# Patient Record
Sex: Male | Born: 1945
Health system: Southern US, Community
[De-identification: ages and names within clinical notes are randomized; demographics above are authoritative.]

## PROBLEM LIST (undated history)

## (undated) DIAGNOSIS — H269 Unspecified cataract: Secondary | ICD-10-CM

## (undated) DIAGNOSIS — K219 Gastro-esophageal reflux disease without esophagitis: Secondary | ICD-10-CM

## (undated) DIAGNOSIS — H409 Unspecified glaucoma: Secondary | ICD-10-CM

## (undated) DIAGNOSIS — K269 Duodenal ulcer, unspecified as acute or chronic, without hemorrhage or perforation: Secondary | ICD-10-CM

## (undated) DIAGNOSIS — Z77098 Contact with and (suspected) exposure to other hazardous, chiefly nonmedicinal, chemicals: Secondary | ICD-10-CM

## (undated) DIAGNOSIS — T7840XA Allergy, unspecified, initial encounter: Secondary | ICD-10-CM

## (undated) DIAGNOSIS — Z7739 Contact with and (suspected) exposure to other war theater: Secondary | ICD-10-CM

## (undated) DIAGNOSIS — Z5189 Encounter for other specified aftercare: Secondary | ICD-10-CM

## (undated) DIAGNOSIS — E7800 Pure hypercholesterolemia, unspecified: Secondary | ICD-10-CM

## (undated) DIAGNOSIS — I10 Essential (primary) hypertension: Secondary | ICD-10-CM

## (undated) DIAGNOSIS — R011 Cardiac murmur, unspecified: Secondary | ICD-10-CM

## (undated) DIAGNOSIS — K449 Diaphragmatic hernia without obstruction or gangrene: Secondary | ICD-10-CM

## (undated) DIAGNOSIS — M1990 Unspecified osteoarthritis, unspecified site: Secondary | ICD-10-CM

## (undated) DIAGNOSIS — D649 Anemia, unspecified: Secondary | ICD-10-CM

## (undated) DIAGNOSIS — C801 Malignant (primary) neoplasm, unspecified: Secondary | ICD-10-CM

## (undated) HISTORY — DX: Malignant (primary) neoplasm, unspecified: C80.1

## (undated) HISTORY — DX: Unspecified osteoarthritis, unspecified site: M19.90

## (undated) HISTORY — DX: Duodenal ulcer, unspecified as acute or chronic, without hemorrhage or perforation: K26.9

## (undated) HISTORY — DX: Contact with and (suspected) exposure to other war theater: Z77.39

## (undated) HISTORY — PX: CAPSULOTOMY: SHX379

## (undated) HISTORY — PX: APPENDECTOMY: SHX54

## (undated) HISTORY — DX: Contact with and (suspected) exposure to other hazardous, chiefly nonmedicinal, chemicals: Z77.098

## (undated) HISTORY — PX: HAND SURGERY: SHX662

## (undated) HISTORY — DX: Unspecified cataract: H26.9

## (undated) HISTORY — DX: Unspecified glaucoma: H40.9

## (undated) HISTORY — DX: Anemia, unspecified: D64.9

## (undated) HISTORY — DX: Encounter for other specified aftercare: Z51.89

## (undated) HISTORY — DX: Cardiac murmur, unspecified: R01.1

## (undated) HISTORY — DX: Allergy, unspecified, initial encounter: T78.40XA

---

## 2001-02-09 ENCOUNTER — Encounter: Payer: Self-pay | Admitting: Orthopedic Surgery

## 2001-02-09 ENCOUNTER — Ambulatory Visit (HOSPITAL_COMMUNITY): Admission: RE | Admit: 2001-02-09 | Discharge: 2001-02-09 | Payer: Self-pay | Admitting: Orthopedic Surgery

## 2001-02-24 ENCOUNTER — Encounter: Payer: Self-pay | Admitting: Orthopedic Surgery

## 2001-03-03 ENCOUNTER — Observation Stay (HOSPITAL_COMMUNITY): Admission: RE | Admit: 2001-03-03 | Discharge: 2001-03-04 | Payer: Self-pay | Admitting: Orthopedic Surgery

## 2003-09-07 HISTORY — PX: ROTATOR CUFF REPAIR: SHX139

## 2011-09-07 HISTORY — PX: CATARACT EXTRACTION: SUR2

## 2013-09-06 HISTORY — PX: HAND SURGERY: SHX662

## 2014-11-04 DIAGNOSIS — Z961 Presence of intraocular lens: Secondary | ICD-10-CM | POA: Diagnosis not present

## 2014-12-24 DIAGNOSIS — E780 Pure hypercholesterolemia: Secondary | ICD-10-CM | POA: Diagnosis not present

## 2014-12-24 DIAGNOSIS — I10 Essential (primary) hypertension: Secondary | ICD-10-CM | POA: Diagnosis not present

## 2014-12-24 DIAGNOSIS — Z79899 Other long term (current) drug therapy: Secondary | ICD-10-CM | POA: Diagnosis not present

## 2014-12-24 DIAGNOSIS — K219 Gastro-esophageal reflux disease without esophagitis: Secondary | ICD-10-CM | POA: Diagnosis not present

## 2014-12-24 DIAGNOSIS — Z1389 Encounter for screening for other disorder: Secondary | ICD-10-CM | POA: Diagnosis not present

## 2014-12-24 DIAGNOSIS — Z6828 Body mass index (BMI) 28.0-28.9, adult: Secondary | ICD-10-CM | POA: Diagnosis not present

## 2015-01-02 DIAGNOSIS — L570 Actinic keratosis: Secondary | ICD-10-CM | POA: Diagnosis not present

## 2015-03-17 DIAGNOSIS — M7551 Bursitis of right shoulder: Secondary | ICD-10-CM | POA: Diagnosis not present

## 2015-06-26 DIAGNOSIS — Z23 Encounter for immunization: Secondary | ICD-10-CM | POA: Diagnosis not present

## 2015-06-26 DIAGNOSIS — Z125 Encounter for screening for malignant neoplasm of prostate: Secondary | ICD-10-CM | POA: Diagnosis not present

## 2015-06-26 DIAGNOSIS — I10 Essential (primary) hypertension: Secondary | ICD-10-CM | POA: Diagnosis not present

## 2015-06-26 DIAGNOSIS — K219 Gastro-esophageal reflux disease without esophagitis: Secondary | ICD-10-CM | POA: Diagnosis not present

## 2015-06-26 DIAGNOSIS — Z9181 History of falling: Secondary | ICD-10-CM | POA: Diagnosis not present

## 2015-06-26 DIAGNOSIS — Z139 Encounter for screening, unspecified: Secondary | ICD-10-CM | POA: Diagnosis not present

## 2015-06-26 DIAGNOSIS — Z1389 Encounter for screening for other disorder: Secondary | ICD-10-CM | POA: Diagnosis not present

## 2015-06-26 DIAGNOSIS — Z0000 Encounter for general adult medical examination without abnormal findings: Secondary | ICD-10-CM | POA: Diagnosis not present

## 2015-06-26 DIAGNOSIS — E7800 Pure hypercholesterolemia, unspecified: Secondary | ICD-10-CM | POA: Diagnosis not present

## 2015-08-18 DIAGNOSIS — I493 Ventricular premature depolarization: Secondary | ICD-10-CM | POA: Diagnosis not present

## 2015-08-18 DIAGNOSIS — Z6829 Body mass index (BMI) 29.0-29.9, adult: Secondary | ICD-10-CM | POA: Diagnosis not present

## 2015-08-18 DIAGNOSIS — I10 Essential (primary) hypertension: Secondary | ICD-10-CM | POA: Diagnosis not present

## 2015-09-18 DIAGNOSIS — E7800 Pure hypercholesterolemia, unspecified: Secondary | ICD-10-CM | POA: Diagnosis not present

## 2015-09-18 DIAGNOSIS — I10 Essential (primary) hypertension: Secondary | ICD-10-CM | POA: Diagnosis not present

## 2015-12-15 DIAGNOSIS — Z961 Presence of intraocular lens: Secondary | ICD-10-CM | POA: Diagnosis not present

## 2015-12-15 DIAGNOSIS — H524 Presbyopia: Secondary | ICD-10-CM | POA: Diagnosis not present

## 2015-12-25 DIAGNOSIS — E663 Overweight: Secondary | ICD-10-CM | POA: Diagnosis not present

## 2015-12-25 DIAGNOSIS — J309 Allergic rhinitis, unspecified: Secondary | ICD-10-CM | POA: Diagnosis not present

## 2015-12-25 DIAGNOSIS — E7800 Pure hypercholesterolemia, unspecified: Secondary | ICD-10-CM | POA: Diagnosis not present

## 2015-12-25 DIAGNOSIS — I10 Essential (primary) hypertension: Secondary | ICD-10-CM | POA: Diagnosis not present

## 2015-12-25 DIAGNOSIS — K219 Gastro-esophageal reflux disease without esophagitis: Secondary | ICD-10-CM | POA: Diagnosis not present

## 2016-01-06 DIAGNOSIS — L570 Actinic keratosis: Secondary | ICD-10-CM | POA: Diagnosis not present

## 2016-01-06 DIAGNOSIS — L821 Other seborrheic keratosis: Secondary | ICD-10-CM | POA: Diagnosis not present

## 2016-07-01 DIAGNOSIS — Z9181 History of falling: Secondary | ICD-10-CM | POA: Diagnosis not present

## 2016-07-01 DIAGNOSIS — Z0000 Encounter for general adult medical examination without abnormal findings: Secondary | ICD-10-CM | POA: Diagnosis not present

## 2016-07-01 DIAGNOSIS — Z79899 Other long term (current) drug therapy: Secondary | ICD-10-CM | POA: Diagnosis not present

## 2016-07-01 DIAGNOSIS — R251 Tremor, unspecified: Secondary | ICD-10-CM | POA: Diagnosis not present

## 2016-07-01 DIAGNOSIS — Z1389 Encounter for screening for other disorder: Secondary | ICD-10-CM | POA: Diagnosis not present

## 2016-07-01 DIAGNOSIS — E7800 Pure hypercholesterolemia, unspecified: Secondary | ICD-10-CM | POA: Diagnosis not present

## 2016-07-01 DIAGNOSIS — Z23 Encounter for immunization: Secondary | ICD-10-CM | POA: Diagnosis not present

## 2016-07-01 DIAGNOSIS — Z125 Encounter for screening for malignant neoplasm of prostate: Secondary | ICD-10-CM | POA: Diagnosis not present

## 2016-07-01 DIAGNOSIS — I10 Essential (primary) hypertension: Secondary | ICD-10-CM | POA: Diagnosis not present

## 2016-11-30 DIAGNOSIS — C44619 Basal cell carcinoma of skin of left upper limb, including shoulder: Secondary | ICD-10-CM | POA: Diagnosis not present

## 2016-11-30 DIAGNOSIS — L570 Actinic keratosis: Secondary | ICD-10-CM | POA: Diagnosis not present

## 2016-12-16 DIAGNOSIS — H524 Presbyopia: Secondary | ICD-10-CM | POA: Diagnosis not present

## 2016-12-23 DIAGNOSIS — C44619 Basal cell carcinoma of skin of left upper limb, including shoulder: Secondary | ICD-10-CM | POA: Diagnosis not present

## 2016-12-31 DIAGNOSIS — I10 Essential (primary) hypertension: Secondary | ICD-10-CM | POA: Diagnosis not present

## 2016-12-31 DIAGNOSIS — E7800 Pure hypercholesterolemia, unspecified: Secondary | ICD-10-CM | POA: Diagnosis not present

## 2016-12-31 DIAGNOSIS — K219 Gastro-esophageal reflux disease without esophagitis: Secondary | ICD-10-CM | POA: Diagnosis not present

## 2017-03-28 DIAGNOSIS — H26491 Other secondary cataract, right eye: Secondary | ICD-10-CM | POA: Diagnosis not present

## 2017-04-13 DIAGNOSIS — H26491 Other secondary cataract, right eye: Secondary | ICD-10-CM | POA: Diagnosis not present

## 2017-05-17 DIAGNOSIS — L570 Actinic keratosis: Secondary | ICD-10-CM | POA: Diagnosis not present

## 2017-06-27 ENCOUNTER — Inpatient Hospital Stay (HOSPITAL_COMMUNITY)
Admission: EM | Admit: 2017-06-27 | Discharge: 2017-06-29 | DRG: 378 | Disposition: A | Discharge status: Home / Self Care | Payer: Medicare Other | Attending: Internal Medicine | Admitting: Internal Medicine

## 2017-06-27 ENCOUNTER — Encounter (HOSPITAL_COMMUNITY): Payer: Self-pay | Admitting: *Deleted

## 2017-06-27 DIAGNOSIS — D62 Acute posthemorrhagic anemia: Secondary | ICD-10-CM | POA: Diagnosis not present

## 2017-06-27 DIAGNOSIS — K219 Gastro-esophageal reflux disease without esophagitis: Secondary | ICD-10-CM | POA: Diagnosis not present

## 2017-06-27 DIAGNOSIS — D649 Anemia, unspecified: Secondary | ICD-10-CM | POA: Diagnosis present

## 2017-06-27 DIAGNOSIS — I10 Essential (primary) hypertension: Secondary | ICD-10-CM | POA: Diagnosis not present

## 2017-06-27 DIAGNOSIS — E785 Hyperlipidemia, unspecified: Secondary | ICD-10-CM | POA: Diagnosis present

## 2017-06-27 DIAGNOSIS — K319 Disease of stomach and duodenum, unspecified: Secondary | ICD-10-CM | POA: Diagnosis not present

## 2017-06-27 DIAGNOSIS — K254 Chronic or unspecified gastric ulcer with hemorrhage: Principal | ICD-10-CM | POA: Diagnosis present

## 2017-06-27 DIAGNOSIS — Z79899 Other long term (current) drug therapy: Secondary | ICD-10-CM

## 2017-06-27 DIAGNOSIS — R0602 Shortness of breath: Secondary | ICD-10-CM | POA: Diagnosis not present

## 2017-06-27 DIAGNOSIS — K922 Gastrointestinal hemorrhage, unspecified: Secondary | ICD-10-CM | POA: Diagnosis present

## 2017-06-27 DIAGNOSIS — Z7982 Long term (current) use of aspirin: Secondary | ICD-10-CM

## 2017-06-27 DIAGNOSIS — R002 Palpitations: Secondary | ICD-10-CM | POA: Diagnosis not present

## 2017-06-27 DIAGNOSIS — R195 Other fecal abnormalities: Secondary | ICD-10-CM

## 2017-06-27 DIAGNOSIS — K449 Diaphragmatic hernia without obstruction or gangrene: Secondary | ICD-10-CM | POA: Diagnosis not present

## 2017-06-27 DIAGNOSIS — K2981 Duodenitis with bleeding: Secondary | ICD-10-CM | POA: Diagnosis not present

## 2017-06-27 DIAGNOSIS — K269 Duodenal ulcer, unspecified as acute or chronic, without hemorrhage or perforation: Secondary | ICD-10-CM | POA: Diagnosis not present

## 2017-06-27 DIAGNOSIS — K257 Chronic gastric ulcer without hemorrhage or perforation: Secondary | ICD-10-CM | POA: Diagnosis present

## 2017-06-27 DIAGNOSIS — Z23 Encounter for immunization: Secondary | ICD-10-CM | POA: Diagnosis not present

## 2017-06-27 DIAGNOSIS — Z882 Allergy status to sulfonamides status: Secondary | ICD-10-CM

## 2017-06-27 DIAGNOSIS — Z862 Personal history of diseases of the blood and blood-forming organs and certain disorders involving the immune mechanism: Secondary | ICD-10-CM | POA: Diagnosis present

## 2017-06-27 HISTORY — DX: Gastro-esophageal reflux disease without esophagitis: K21.9

## 2017-06-27 HISTORY — DX: Pure hypercholesterolemia, unspecified: E78.00

## 2017-06-27 HISTORY — DX: Essential (primary) hypertension: I10

## 2017-06-27 HISTORY — DX: Diaphragmatic hernia without obstruction or gangrene: K44.9

## 2017-06-27 LAB — COMPREHENSIVE METABOLIC PANEL
ALT: 30 U/L (ref 17–63)
ANION GAP: 7 (ref 5–15)
AST: 32 U/L (ref 15–41)
Albumin: 4.1 g/dL (ref 3.5–5.0)
Alkaline Phosphatase: 40 U/L (ref 38–126)
BILIRUBIN TOTAL: 0.3 mg/dL (ref 0.3–1.2)
BUN: 11 mg/dL (ref 6–20)
CALCIUM: 9 mg/dL (ref 8.9–10.3)
CO2: 23 mmol/L (ref 22–32)
CREATININE: 0.83 mg/dL (ref 0.61–1.24)
Chloride: 107 mmol/L (ref 101–111)
GLUCOSE: 126 mg/dL — AB (ref 65–99)
POTASSIUM: 3.5 mmol/L (ref 3.5–5.1)
Sodium: 137 mmol/L (ref 135–145)
Total Protein: 6.5 g/dL (ref 6.5–8.1)

## 2017-06-27 LAB — CBC
HCT: 22.5 % — ABNORMAL LOW (ref 39.0–52.0)
HEMOGLOBIN: 6.7 g/dL — AB (ref 13.0–17.0)
MCH: 24.5 pg — AB (ref 26.0–34.0)
MCHC: 29.8 g/dL — AB (ref 30.0–36.0)
MCV: 82.4 fL (ref 78.0–100.0)
PLATELETS: 341 10*3/uL (ref 150–400)
RBC: 2.73 MIL/uL — ABNORMAL LOW (ref 4.22–5.81)
RDW: 16.3 % — ABNORMAL HIGH (ref 11.5–15.5)
WBC: 9 10*3/uL (ref 4.0–10.5)

## 2017-06-27 LAB — PREPARE RBC (CROSSMATCH)

## 2017-06-27 LAB — POC OCCULT BLOOD, ED: Fecal Occult Bld: POSITIVE — AB

## 2017-06-27 LAB — ABO/RH: ABO/RH(D): O NEG

## 2017-06-27 MED ORDER — PANTOPRAZOLE SODIUM 40 MG IV SOLR: 40.0000 mg | Freq: Two times a day (BID) | INTRAVENOUS | Status: DC

## 2017-06-27 MED ORDER — ACETAMINOPHEN 325 MG PO TABS: 650.0000 mg | ORAL_TABLET | Freq: Four times a day (QID) | ORAL | Status: DC | PRN

## 2017-06-27 MED ORDER — SODIUM CHLORIDE 0.9 % IV SOLN
Freq: Once | INTRAVENOUS | Status: AC
  Administered 2017-06-27: 22:00:00 via INTRAVENOUS

## 2017-06-27 MED ORDER — ONDANSETRON HCL 4 MG/2ML IJ SOLN: 4.0000 mg | Freq: Four times a day (QID) | INTRAMUSCULAR | Status: DC | PRN

## 2017-06-27 MED ORDER — PANTOPRAZOLE SODIUM 40 MG IV SOLR
40.0000 mg | Freq: Once | INTRAVENOUS | Status: AC
  Administered 2017-06-28: 40 mg via INTRAVENOUS
  Filled 2017-06-27: qty 40

## 2017-06-27 MED ORDER — SODIUM CHLORIDE 0.9 % IV SOLN
INTRAVENOUS | Status: AC
  Administered 2017-06-28 (×2): via INTRAVENOUS

## 2017-06-27 MED ORDER — SODIUM CHLORIDE 0.9 % IV SOLN
8.0000 mg/h | INTRAVENOUS | Status: DC
  Administered 2017-06-28 (×2): 8 mg/h via INTRAVENOUS
  Filled 2017-06-27 (×3): qty 80

## 2017-06-27 MED ORDER — HYDRALAZINE HCL 20 MG/ML IJ SOLN: 10.0000 mg | INTRAMUSCULAR | Status: DC | PRN

## 2017-06-27 MED ORDER — ACETAMINOPHEN 650 MG RE SUPP: 650.0000 mg | Freq: Four times a day (QID) | RECTAL | Status: DC | PRN

## 2017-06-27 MED ORDER — ONDANSETRON HCL 4 MG PO TABS: 4.0000 mg | ORAL_TABLET | Freq: Four times a day (QID) | ORAL | Status: DC | PRN

## 2017-06-27 NOTE — H&P (Signed)
History and Physical    Leroy Horton HBZ:[id number] DOB: 08-07-1946 DOA: 06/27/2017  PCP: No primary care provider on file.  Patient coming from: Home.  Chief Complaint: Weakness.  HPI: Leroy Horton is a 71 y.o. male with history of hypertension and hyperlipidemia and GERD has been experiencing weakness and fatigue over the last 4-5 days.  Patient had followed up with his primary care physician today and was found to have a hemoglobin around 6.  Patient also has been noticing some black stools.  Patient takes ibuprofen off and on for pain.  Patient was referred to the ER for symptomatic anemia likely from GI bleed.  ED Course: In the ER patient was stool for occult blood positive.  Hemoglobin was around 6 and 1 unit of PRBC was ordered.  Patient was started on Protonix.  Admitted for GI bleeding.  Patient states he has never had any EGD or colonoscopy previously.  Review of Systems: As per HPI, rest all negative.   Past Medical History:  Diagnosis Date  . Acid reflux   . High cholesterol   . Hypertension     Past Surgical History:  Procedure Laterality Date  . APPENDECTOMY    . HAND SURGERY       reports that he has never smoked. He has never used smokeless tobacco. He reports that he drinks alcohol. He reports that he does not use drugs.  Allergies  Allergen Reactions  . Sulfa Antibiotics   . Sulfamethoxazole Rash    Family History  Problem Relation Age of Onset  . Cancer Neg Hx     Prior to Admission medications   Medication Sig Start Date End Date Taking? Authorizing Provider  amLODipine (NORVASC) 10 MG tablet Take 10 mg by mouth daily. 05/20/17  Yes [provider]  fluticasone (FLONASE) 50 MCG/ACT nasal spray Place 2 sprays into both nostrils daily as needed for allergies.  06/16/17  Yes [provider]  losartan (COZAAR) 100 MG tablet Take 100 mg by mouth daily. 05/20/17  Yes [provider]  pravastatin (PRAVACHOL) 40 MG tablet  Take 20 mg by mouth at bedtime.  03/22/17  Yes [provider]  ranitidine (ZANTAC) 300 MG tablet Take 300 mg by mouth at bedtime.  06/14/17  Yes [provider]    Physical Exam: Vitals:   06/27/17 2215 06/27/17 2230 06/27/17 2245 06/27/17 2300  BP: 126/70 136/73 124/74 117/81  Pulse: 89 85 82 82  Resp: 12 16 20 15   Temp:      TempSrc:      SpO2: 98% 97% 98% 98%      Constitutional: Moderately built and nourished. Vitals:   06/27/17 2215 06/27/17 2230 06/27/17 2245 06/27/17 2300  BP: 126/70 136/73 124/74 117/81  Pulse: 89 85 82 82  Resp: 12 16 20 15   Temp:      TempSrc:      SpO2: 98% 97% 98% 98%   Eyes: Anicteric mild pallor. ENMT: No discharge from the ears eyes nose or mouth. Neck: No mass felt.  No JVD appreciated. Respiratory: No rhonchi or crepitations. Cardiovascular: S1-S2 heard no murmurs appreciated. Abdomen: Soft nontender bowel sounds present. Musculoskeletal: No edema.  No joint effusion. Skin: No rash.  The skin appears warm. Neurologic: Alert awake oriented to time place and person.  Moves all extremities. Psychiatric: Appears normal.  Normal affect.   Labs on Admission: I have personally reviewed following labs and imaging studies  CBC:  Recent Labs Lab 06/27/17  1731  WBC 9.0  HGB 6.7*  HCT 22.5*  MCV 82.4  PLT 287   Basic Metabolic Panel:  Recent Labs Lab 06/27/17 1731  NA 137  K 3.5  CL 107  CO2 23  GLUCOSE 126*  BUN 11  CREATININE 0.83  CALCIUM 9.0   GFR: CrCl cannot be calculated (Unknown ideal weight.). Liver Function Tests:  Recent Labs Lab 06/27/17 1731  AST 32  ALT 30  ALKPHOS 40  BILITOT 0.3  PROT 6.5  ALBUMIN 4.1   No results for input(s): LIPASE, AMYLASE in the last 168 hours. No results for input(s): AMMONIA in the last 168 hours. Coagulation Profile: No results for input(s): INR, PROTIME in the last 168 hours. Cardiac Enzymes: No results for input(s): CKTOTAL, CKMB, CKMBINDEX, TROPONINI  in the last 168 hours. BNP (last 3 results) No results for input(s): PROBNP in the last 8760 hours. HbA1C: No results for input(s): HGBA1C in the last 72 hours. CBG: No results for input(s): GLUCAP in the last 168 hours. Lipid Profile: No results for input(s): CHOL, HDL, LDLCALC, TRIG, CHOLHDL, LDLDIRECT in the last 72 hours. Thyroid Function Tests: No results for input(s): TSH, T4TOTAL, FREET4, T3FREE, THYROIDAB in the last 72 hours. Anemia Panel: No results for input(s): VITAMINB12, FOLATE, FERRITIN, TIBC, IRON, RETICCTPCT in the last 72 hours. Urine analysis: No results found for: COLORURINE, APPEARANCEUR, LABSPEC, PHURINE, GLUCOSEU, HGBUR, BILIRUBINUR, KETONESUR, PROTEINUR, UROBILINOGEN, NITRITE, LEUKOCYTESUR Sepsis Labs: @LABRCNTIP (procalcitonin:4,lacticidven:4) )No results found for this or any previous visit (from the past 240 hour(s)).   Radiological Exams on Admission: No results found.   Assessment/Plan Principal Problem:   Acute GI bleeding Active Problems:   Acute blood loss anemia   GERD (gastroesophageal reflux disease)   Essential hypertension    1. Acute GI bleeding -likely upper GI bleed.  Patient does take NSAIDs.  Patient also had melanotic stools.  Patient has been placed on Protonix infusion.  1 unit of PRBC transfusion has been ordered.  Recheck CBC.  Consult gastroenterologist in a.m.  Patient has never had any EGD or colonoscopy previously. 2. Acute blood loss anemia -follow CBC after transfusion. 3. Hypertension -we will keep patient on as needed IV hydralazine for now since patient is n.p.o. 4. Hyperlipidemia on statins. 5. History of GERD.   DVT prophylaxis: SCDs. Code Status: Full code. Family Communication: Discussed with patient's wife. Disposition Plan: Home. Consults called: None. Admission status: Observation.   Rise Patience MD [facility] Pager [phone].  If 7PM-7AM, please contact  night-coverage [email or web address] Password [email or web address]  06/27/2017, 11:26 PM

## 2017-06-27 NOTE — ED Provider Notes (Signed)
[facility] EMERGENCY DEPARTMENT Provider Note   CSN: [id number] Arrival date & time: 06/27/17  1706     History   Chief Complaint Chief Complaint  Patient presents with  . Weakness  . Abnormal Lab    HPI Leroy Horton is a 71 y.o. male with a history of hypertension, GERD, hypercholesteremia, and chronic low back pain who presents to the emergency department with a chief complaint of weakness and lightheadedness that began 3 days ago. No h/o of similar. No recent trauma or injuries. He also complains of intermittent dyspnea with exertion and palpitations that began at the same time. He denies chest pain or cough. He states that he also noticed that his stools seemed "darker brown" than usual, but denies BRBPR. No hematemesis, syncope, or N/V/D. he reports that his symptoms worsen 2 days ago, and he was concerned that his symptoms were a side effect of his blood pressure medication so his cut his amlodipine in half. Today, he reports he halved his amlodipine and losartan without improvement so he scheduled an appointment with his PCP for evaluation.  Patient reports that he was established with GI in 1990's when he was having some difficulty swallowing. He reports he has an EGD performed and his esophagus was stretched. He has never had a colonoscopy. He is currently taking Zantac prescribed by his PCP, but was previously taking Prilosec for years. He reports occasional alcohol use.   PCP is Dr. Cyndi Bender at [facility].  The history is provided by the patient. No language interpreter was used.    Past Medical History:  Diagnosis Date  . Acid reflux   . High cholesterol   . Hypertension     Patient Active Problem List   Diagnosis Date Noted  . Acute GI bleeding 06/27/2017    Past Surgical History:  Procedure Laterality Date  . APPENDECTOMY    . HAND SURGERY       Home Medications    Prior to Admission medications   Medication Sig  Start Date End Date Taking? Authorizing Provider  amLODipine (NORVASC) 10 MG tablet Take 10 mg by mouth daily. 05/20/17  Yes [provider]  fluticasone (FLONASE) 50 MCG/ACT nasal spray Place 2 sprays into both nostrils daily as needed for allergies.  06/16/17  Yes [provider]  losartan (COZAAR) 100 MG tablet Take 100 mg by mouth daily. 05/20/17  Yes [provider]  pravastatin (PRAVACHOL) 40 MG tablet Take 20 mg by mouth at bedtime.  03/22/17  Yes [provider]  ranitidine (ZANTAC) 300 MG tablet Take 300 mg by mouth at bedtime.  06/14/17  Yes [provider]    Family History Family History  Problem Relation Age of Onset  . Cancer Neg Hx     Social History Social History  Substance Use Topics  . Smoking status: Never Smoker  . Smokeless tobacco: Never Used  . Alcohol use Yes     Comment: occ     Allergies   Sulfa antibiotics and Sulfamethoxazole   Review of Systems Review of Systems  Constitutional: Negative for activity change, chills and fever.  Respiratory: Negative for shortness of breath.   Cardiovascular: Negative for chest pain.  Gastrointestinal: Positive for blood in stool. Negative for abdominal pain, constipation, diarrhea, nausea and vomiting.  Genitourinary: Negative for hematuria.  Musculoskeletal: Negative for back pain.  Skin: Negative for rash and wound.  Allergic/Immunologic: Negative for immunocompromised state.  Neurological: Positive for dizziness, weakness  and light-headedness. Negative for headaches.     Physical Exam Updated Vital Signs BP 140/73   Pulse 87   Temp 98.2 F (36.8 C) (Oral)   Resp 12   SpO2 98%   Physical Exam  Constitutional: He appears well-developed.  HENT:  Head: Normocephalic.  Eyes: Conjunctivae are normal.  Neck: Neck supple.  Cardiovascular: Normal rate, regular rhythm and normal heart sounds.  Exam reveals no gallop and no friction rub.   No murmur  heard. Pulmonary/Chest: Effort normal and breath sounds normal. No respiratory distress. He has no wheezes. He has no rales.  Abdominal: Soft. Bowel sounds are normal. He exhibits no distension and no mass. There is no tenderness. There is no rebound and no guarding.  No CVA tenderness bilaterally. No pulsating masses. Negative Murphy sign. No tenderness over McBurney's point. No peritoneal signs.  Genitourinary:  Genitourinary Comments: Chaperoned exam. Soft brown stool noted. No gross blood. The prostate is non-tender. No fissures or tears noted near the rectum.   Musculoskeletal: He exhibits no tenderness.  Neurological: He is alert.  Skin: Skin is warm and dry. Capillary refill takes less than 2 seconds. There is pallor.  Psychiatric: His behavior is normal.  Nursing note and vitals reviewed.    ED Treatments / Results  Labs (all labs ordered are listed, but only abnormal results are displayed) Labs Reviewed  COMPREHENSIVE METABOLIC PANEL - Abnormal; Notable for the following:       Result Value   Glucose, Bld 126 (*)    All other components within normal limits  CBC - Abnormal; Notable for the following:    RBC 2.73 (*)    Hemoglobin 6.7 (*)    HCT 22.5 (*)    MCH 24.5 (*)    MCHC 29.8 (*)    RDW 16.3 (*)    All other components within normal limits  POC OCCULT BLOOD, ED - Abnormal; Notable for the following:    Fecal Occult Bld POSITIVE (*)    All other components within normal limits  TYPE AND SCREEN  ABO/RH  PREPARE RBC (CROSSMATCH)    EKG  EKG Interpretation None       Radiology No results found.  Procedures Procedures (including critical care time) CRITICAL CARE Performed by: Joanne Gavel Total critical care time: 30 minutes Critical care time was exclusive of separately billable procedures and treating other patients. Critical care was necessary to treat or prevent imminent or life-threatening deterioration. Critical care was time spent personally  by me on the following activities: development of treatment plan with patient and/or surrogate as well as nursing, discussions with consultants, evaluation of patient's response to treatment, examination of patient, obtaining history from patient or surrogate, ordering and performing treatments and interventions, ordering and review of laboratory studies, ordering and review of radiographic studies, pulse oximetry and re-evaluation of patient's condition.  Medications Ordered in ED Medications  pantoprazole (PROTONIX) injection 40 mg (not administered)  0.9 %  sodium chloride infusion ( Intravenous New Bag/Given 06/27/17 2144)     Initial Impression / Assessment and Plan / ED Course  I have reviewed the triage vital signs and the nursing notes.  Pertinent labs & imaging results that were available during my care of the patient were reviewed by me and considered in my medical decision making (see chart for details).     71 year old male with a history of GERD, hypercholesteremia, and hypertension presenting with 3 days of weakness, lightheadedness, and dark stools. Hgb 6.7. The patient  was typed and screened and agreeable to blood transfusion. Hemoccult positive, likely upper GI bleed given his history of GERD. IV Protonix and fluid bolus given in the ED. EGD performed >20 years ago. No h/o of colonoscopy. Consulted the hospitalist, Dr. Hal Hope will admit the patient for symptomatic anemia and positive hemoccult result. The patient appears reasonably stabilized for admission considering the current resources, flow, and capabilities available in the ED at this time, and I doubt any other [facility] requiring further screening and/or treatment in the ED prior to admission.  Final Clinical Impressions(s) / ED Diagnoses   Final diagnoses:  Symptomatic anemia  Heme positive stool    New Prescriptions New Prescriptions   No medications on file     Perrin Smack 06/27/17 2304    Tanna Furry, MD 07/15/17 2307

## 2017-06-27 NOTE — ED Notes (Signed)
Patient is stable and ready to be transport to the floor at this time.  Report was called to 5W RN.  Belongings taken with the patient to the floor.   

## 2017-06-27 NOTE — ED Notes (Signed)
Notified dr Billy Fischer of Hgb 6.7

## 2017-06-27 NOTE — ED Provider Notes (Signed)
Patient seen and evaluated. Discussed with Mia McDonald PA-C. Progressive dyspnea and weakness. Found to have hemoglobin of 6 today. Guaiac positive. Recent dark stools. History of GERD. Take Zantac but not daily. No bright red blood paretic in. Has not had screening colonoscopy in his life. Agree with transfusion, Protonix bolus and infusion. GI consultation and admission.   Tanna Furry, MD 06/27/17 2233

## 2017-06-27 NOTE — ED Triage Notes (Signed)
Pt reports going to pcp due to feeling weak, fatigued and lightheaded. Sent here due to hgb of 6.4. Pt appears very pale at triage. Denies being on blood thinners, reports recent dark stools noted.

## 2017-06-28 ENCOUNTER — Encounter (HOSPITAL_COMMUNITY): Payer: Self-pay | Admitting: *Deleted

## 2017-06-28 ENCOUNTER — Encounter (HOSPITAL_COMMUNITY)
Admission: EM | Disposition: A | Discharge status: Home / Self Care | Payer: Self-pay | Source: Home / Self Care | Attending: Internal Medicine

## 2017-06-28 DIAGNOSIS — K269 Duodenal ulcer, unspecified as acute or chronic, without hemorrhage or perforation: Secondary | ICD-10-CM

## 2017-06-28 DIAGNOSIS — K921 Melena: Secondary | ICD-10-CM | POA: Diagnosis not present

## 2017-06-28 DIAGNOSIS — K257 Chronic gastric ulcer without hemorrhage or perforation: Secondary | ICD-10-CM | POA: Diagnosis present

## 2017-06-28 DIAGNOSIS — I8500 Esophageal varices without bleeding: Secondary | ICD-10-CM | POA: Diagnosis not present

## 2017-06-28 DIAGNOSIS — K219 Gastro-esophageal reflux disease without esophagitis: Secondary | ICD-10-CM | POA: Diagnosis present

## 2017-06-28 DIAGNOSIS — Z79899 Other long term (current) drug therapy: Secondary | ICD-10-CM | POA: Diagnosis not present

## 2017-06-28 DIAGNOSIS — K2981 Duodenitis with bleeding: Secondary | ICD-10-CM | POA: Diagnosis not present

## 2017-06-28 DIAGNOSIS — E785 Hyperlipidemia, unspecified: Secondary | ICD-10-CM | POA: Diagnosis present

## 2017-06-28 DIAGNOSIS — D649 Anemia, unspecified: Secondary | ICD-10-CM | POA: Diagnosis not present

## 2017-06-28 DIAGNOSIS — K449 Diaphragmatic hernia without obstruction or gangrene: Secondary | ICD-10-CM | POA: Diagnosis present

## 2017-06-28 DIAGNOSIS — Z7982 Long term (current) use of aspirin: Secondary | ICD-10-CM | POA: Diagnosis not present

## 2017-06-28 DIAGNOSIS — K319 Disease of stomach and duodenum, unspecified: Secondary | ICD-10-CM | POA: Diagnosis present

## 2017-06-28 DIAGNOSIS — R195 Other fecal abnormalities: Secondary | ICD-10-CM | POA: Diagnosis present

## 2017-06-28 DIAGNOSIS — K254 Chronic or unspecified gastric ulcer with hemorrhage: Secondary | ICD-10-CM | POA: Diagnosis present

## 2017-06-28 DIAGNOSIS — K3189 Other diseases of stomach and duodenum: Secondary | ICD-10-CM | POA: Diagnosis not present

## 2017-06-28 DIAGNOSIS — Z882 Allergy status to sulfonamides status: Secondary | ICD-10-CM | POA: Diagnosis not present

## 2017-06-28 DIAGNOSIS — I10 Essential (primary) hypertension: Secondary | ICD-10-CM | POA: Diagnosis not present

## 2017-06-28 DIAGNOSIS — K922 Gastrointestinal hemorrhage, unspecified: Secondary | ICD-10-CM | POA: Diagnosis not present

## 2017-06-28 DIAGNOSIS — Z862 Personal history of diseases of the blood and blood-forming organs and certain disorders involving the immune mechanism: Secondary | ICD-10-CM | POA: Diagnosis present

## 2017-06-28 DIAGNOSIS — D62 Acute posthemorrhagic anemia: Secondary | ICD-10-CM

## 2017-06-28 HISTORY — PX: ESOPHAGOGASTRODUODENOSCOPY: SHX5428

## 2017-06-28 HISTORY — DX: Diaphragmatic hernia without obstruction or gangrene: K44.9

## 2017-06-28 LAB — GLUCOSE, CAPILLARY
GLUCOSE-CAPILLARY: 92 mg/dL (ref 65–99)
GLUCOSE-CAPILLARY: 96 mg/dL (ref 65–99)

## 2017-06-28 LAB — CBC
HCT: 21.8 % — ABNORMAL LOW (ref 39.0–52.0)
HCT: 22.9 % — ABNORMAL LOW (ref 39.0–52.0)
HEMATOCRIT: 26.1 % — AB (ref 39.0–52.0)
HEMOGLOBIN: 7.2 g/dL — AB (ref 13.0–17.0)
HEMOGLOBIN: 8.2 g/dL — AB (ref 13.0–17.0)
Hemoglobin: 6.8 g/dL — CL (ref 13.0–17.0)
MCH: 25.8 pg — ABNORMAL LOW (ref 26.0–34.0)
MCH: 25.9 pg — ABNORMAL LOW (ref 26.0–34.0)
MCH: 26.2 pg (ref 26.0–34.0)
MCHC: 31.2 g/dL (ref 30.0–36.0)
MCHC: 31.4 g/dL (ref 30.0–36.0)
MCHC: 31.4 g/dL (ref 30.0–36.0)
MCV: 82.6 fL (ref 78.0–100.0)
MCV: 82.6 fL (ref 78.0–100.0)
MCV: 83.3 fL (ref 78.0–100.0)
PLATELETS: 286 10*3/uL (ref 150–400)
PLATELETS: 295 10*3/uL (ref 150–400)
Platelets: 293 10*3/uL (ref 150–400)
RBC: 2.64 MIL/uL — AB (ref 4.22–5.81)
RBC: 2.75 MIL/uL — ABNORMAL LOW (ref 4.22–5.81)
RBC: 3.16 MIL/uL — AB (ref 4.22–5.81)
RDW: 15.7 % — ABNORMAL HIGH (ref 11.5–15.5)
RDW: 15.8 % — ABNORMAL HIGH (ref 11.5–15.5)
RDW: 16.1 % — ABNORMAL HIGH (ref 11.5–15.5)
WBC: 10 10*3/uL (ref 4.0–10.5)
WBC: 8.3 10*3/uL (ref 4.0–10.5)
WBC: 9.5 10*3/uL (ref 4.0–10.5)

## 2017-06-28 LAB — BASIC METABOLIC PANEL
ANION GAP: 6 (ref 5–15)
BUN: 11 mg/dL (ref 6–20)
CHLORIDE: 109 mmol/L (ref 101–111)
CO2: 23 mmol/L (ref 22–32)
Calcium: 8.1 mg/dL — ABNORMAL LOW (ref 8.9–10.3)
Creatinine, Ser: 0.72 mg/dL (ref 0.61–1.24)
GFR calc non Af Amer: >60 mL/min (ref >60)
Glucose, Bld: 104 mg/dL — ABNORMAL HIGH (ref 65–99)
POTASSIUM: 3.9 mmol/L (ref 3.5–5.1)
SODIUM: 138 mmol/L (ref 135–145)

## 2017-06-28 LAB — PREPARE RBC (CROSSMATCH)

## 2017-06-28 SURGERY — EGD (ESOPHAGOGASTRODUODENOSCOPY)
Anesthesia: Moderate Sedation

## 2017-06-28 MED ORDER — MIDAZOLAM HCL 10 MG/2ML IJ SOLN
INTRAMUSCULAR | Status: DC | PRN
  Administered 2017-06-28 (×3): 2 mg via INTRAVENOUS

## 2017-06-28 MED ORDER — SODIUM CHLORIDE 0.9 % IV SOLN
Freq: Once | INTRAVENOUS | Status: AC
  Administered 2017-06-28: 06:00:00 via INTRAVENOUS

## 2017-06-28 MED ORDER — PANTOPRAZOLE SODIUM 40 MG IV SOLR: 40.0000 mg | INTRAVENOUS | Status: DC

## 2017-06-28 MED ORDER — MIDAZOLAM HCL 5 MG/ML IJ SOLN
INTRAMUSCULAR | Status: AC
  Filled 2017-06-28: qty 2

## 2017-06-28 MED ORDER — BUTAMBEN-TETRACAINE-BENZOCAINE 2-2-14 % EX AERO
INHALATION_SPRAY | CUTANEOUS | Status: DC | PRN
  Administered 2017-06-28: 2 via TOPICAL

## 2017-06-28 MED ORDER — FENTANYL CITRATE (PF) 100 MCG/2ML IJ SOLN
INTRAMUSCULAR | Status: DC | PRN
  Administered 2017-06-28 (×3): 25 ug via INTRAVENOUS

## 2017-06-28 MED ORDER — PANTOPRAZOLE SODIUM 40 MG PO TBEC
40.0000 mg | DELAYED_RELEASE_TABLET | Freq: Every day | ORAL | Status: DC
Start: 2017-06-29 — End: 2017-06-29
  Administered 2017-06-29: 40 mg via ORAL
  Filled 2017-06-28: qty 1

## 2017-06-28 MED ORDER — SODIUM CHLORIDE 0.9 % IV SOLN
510.0000 mg | Freq: Once | INTRAVENOUS | Status: AC
  Administered 2017-06-28: 510 mg via INTRAVENOUS
  Filled 2017-06-28: qty 17

## 2017-06-28 MED ORDER — FAMOTIDINE 20 MG PO TABS
40.0000 mg | ORAL_TABLET | Freq: Every day | ORAL | Status: DC
  Administered 2017-06-28: 40 mg via ORAL
  Filled 2017-06-28: qty 2

## 2017-06-28 MED ORDER — FENTANYL CITRATE (PF) 100 MCG/2ML IJ SOLN
INTRAMUSCULAR | Status: AC
  Filled 2017-06-28: qty 2

## 2017-06-28 NOTE — Progress Notes (Signed)
Notified MD of HGB 6.8. Orders to transfuse 1 additional unit PRBCs.

## 2017-06-28 NOTE — Op Note (Addendum)
[facility] Patient Name: Leroy Horton Procedure Date : 06/28/2017 MRN: [id number] Attending MD: Gatha Mayer , MD Date of Birth: 10/05/1945 CSN: [id number] Age: 71 Admit Type: Inpatient Procedure:                Upper GI endoscopy Indications:              Acute post hemorrhagic anemia, Heme positive stool Providers:                Gatha Mayer, MD, Burtis Junes, RN, Elspeth Cho                            Tech., Technician Referring MD:              Medicines:                Midazolam 6 mg IV, Fentanyl 75 micrograms IV,                            Cetacaine spray Complications:            No immediate complications. Estimated Blood Loss:     Estimated blood loss was minimal. Procedure:                Pre-Anesthesia Assessment:                           - Prior to the procedure, a History and Physical                            was performed, and patient medications and                            allergies were reviewed. The patient's tolerance of                            previous anesthesia was also reviewed. The risks                            and benefits of the procedure and the sedation                            options and risks were discussed with the patient.                            All questions were answered, and informed consent                            was obtained. Prior Anticoagulants: The patient has                            taken no previous anticoagulant or antiplatelet                            agents. ASA Grade Assessment: II - A patient with  mild systemic disease. After reviewing the risks                            and benefits, the patient was deemed in                            satisfactory condition to undergo the procedure.                           After obtaining informed consent, the endoscope was                            passed under direct vision. Throughout the   procedure, the patient's blood pressure, pulse, and                            oxygen saturations were monitored continuously. The                            EG-[id number] ([id number]) scope was introduced through the                            mouth, and advanced to the second part of duodenum.                            The upper GI endoscopy was accomplished without                            difficulty. The patient tolerated the procedure                            well. Scope In: Scope Out: Findings:      Multiple dispersed, small non-bleeding erosions were found in the       gastric body. There were stigmata of recent bleeding.      A 10 cm hiatal hernia was present.      Many non-bleeding superficial duodenal ulcers were found in the first       portion of the duodenum and in the second portion of the duodenum.      The cardia and gastric fundus were normal on retroflexion.      The exam was otherwise without abnormality.      Biopsies were taken with a cold forceps in the gastric body and in the       gastric antrum for Helicobacter pylori testing using CLOtest.       Verification of patient identification for the specimen was done.       Estimated blood loss was minimal. Impression:               - Non-bleeding erosive gastropathy.                           - 10 cm hiatal hernia.                           - Multiple non-bleeding duodenal ulcers.                           -  The examination was otherwise normal.                           - Biopsies were taken with a cold forceps for                            Helicobacter pylori testing using CLOtest. Moderate Sedation:      Moderate (conscious) sedation was administered by the endoscopy nurse       and supervised by the endoscopist. The following parameters were       monitored: oxygen saturation, heart rate, blood pressure, respiratory       rate, EKG, adequacy of pulmonary ventilation, and response to care.       Total physician  intraservice time was 15 minutes. Recommendation:           - Return patient to hospital ward for ongoing care.                           - Resume regular diet.                           - Will await CLO test - could be false neg due to                            chronic H2 B and acute PPI                           Think he should consiuder an elective hiatal hernia                            repair - will discuss at somne point.                           I think we have explanations for bleeding and                            anemia though I am a bit puzzled by the superficial                            dudenal ulcers one possiblity is transient                            paraesopheagal hiatal hernia and torsion/volvulus                            several days ago.                           OK to go home tomorrow and I will arrange f/u w/ me                            - He has appt 10/29 w/ PCP already  Wants to go to beach later this week - ok by me                           Will do 1 dose feraheme here and send home on Fe                            SO4 325 mg bid w/ food                           Needs reflux precautions, elevate HOB at night                           Daily PPI in AM (pantoprazole 40 mg) and stay on                            300 mg ranitidine at bedtime                           - Continue present medications. Procedure Code(s):        --- Professional ---                           [id number], Esophagogastroduodenoscopy, flexible,                            transoral; with biopsy, single or multiple                           G0500, Moderate sedation services provided by the                            same physician or other qualified health care                            professional performing a gastrointestinal                            endoscopic service that sedation supports,                            requiring the presence of an  independent trained                            observer to assist in the monitoring of the                            patient's level of consciousness and physiological                            status; initial 15 minutes of intra-service time;                            patient age 50 years or older (additional time 73  be reported with [phone], as appropriate) Diagnosis Code(s):        --- Professional ---                           K31.89, Other diseases of stomach and duodenum                           K44.9, Diaphragmatic hernia without obstruction or                            gangrene                           K26.9, Duodenal ulcer, unspecified as acute or                            chronic, without hemorrhage or perforation                           D62, Acute posthemorrhagic anemia                           R19.5, Other fecal abnormalities CPT copyright 2016 [employer]. All rights reserved. The codes documented in this report are preliminary and upon coder review may  be revised to meet current compliance requirements. Gatha Mayer, MD 06/28/2017 3:15:27 PM This report has been signed electronically. Number of Addenda: 0

## 2017-06-28 NOTE — Progress Notes (Signed)
PROGRESS NOTE  Leroy Horton WUX:[id number] DOB: [birth year] DOA: 06/27/2017 PCP: Cyndi Bender, PA-C  HPI/Recap of past 24 hours: Leroy Horton is a 71 year old male with medical history significant for hypertension, GERD, hyperlipidemia, and chronic low back pain who presented to the ED on 06/27/2017 with complaints of weakness and lightheadedness for the past 3 days and was found to have symptomatic anemia with an admitting hemoglobin of 6.7 symptomatic anemia. Note patient reports remote history of EGD for esophageal dilation.  Patient has never had a colonoscopy  This morning patient states he feels much better after having recent blood transfusion.  Continues to deny any nausea, vomiting, abdominal pain, fevers or chills.  Assessment/Plan: Principal Problem:   Acute GI bleeding Active Problems:   Acute blood loss anemia   GERD (gastroesophageal reflux disease)   Essential hypertension   Symptomatic anemia  Symptomatic anemia Given age and no prior colonoscopy concern for colon cancer. Questionable melanotic stool per report but none on DRE. Current hemoglobin 8.2 after 1 unit transfusion -GI consulted, appreciate recs -IV Protonix twice daily -Transfusion for goal hemoglobin greater than 7  GERD Patient on Zantac as outpatient -Continue IV Protonix twice daily until GI evaluation  Hypertension Has had no episodes of hypotension during hospital stay -Holding home losartan and amlodipine to avoid any drops in blood pressure in setting of likely blood loss anemia  Code Status: Full code  Family Communication: No family at bedside, discussed plan with patient  Disposition Plan: Pending GI evaluation   Consultants:  Gastroenterology  Procedures:  None  Antimicrobials:  None  DVT prophylaxis: SCDs   Objective: Vitals:   06/27/17 2351 06/28/17 0524 06/28/17 0553 06/28/17 0810  BP:  127/68 (!) 123/58 133/67  Pulse:  75 74 74  Resp:  18 17   Temp:  98.6 F  (37 C) 98.4 F (36.9 C) 97.9 F (36.6 C)  TempSrc:  Oral Oral Oral  SpO2:  98% 100% 98%  Weight: 83.6 kg (184 lb 4.9 oz)     Height: 5\' 8"  (1.727 m)       Intake/Output Summary (Last 24 hours) at 06/28/17 1357 Last data filed at 06/28/17 0810  Gross per 24 hour  Intake          1138.33 ml  Output                0 ml  Net          1138.33 ml   Filed Weights   06/27/17 2351  Weight: 83.6 kg (184 lb 4.9 oz)    Exam:   General: Lying in bed comfortably, in no obvious distress  Cardiovascular: Regular rate and rhythm, no murmurs rubs or gallops, no peripheral edema  Respiratory: Auscultation bilaterally chest field on anterior chest field   Abdomen: on room air soft, nondistended, nontender, normoactive bowel sounds  Musculoskeletal: Normal range of motion  Psychiatry: Normal affect and mood   Data Reviewed: CBC:  Recent Labs Lab 06/27/17 1731 06/27/17 2353 06/28/17 0351 06/28/17 1112  WBC 9.0 10.0 8.3 9.5  HGB 6.7* 7.2* 6.8* 8.2*  HCT 22.5* 22.9* 21.8* 26.1*  MCV 82.4 83.3 82.6 82.6  PLT 341 295 286 253   Basic Metabolic Panel:  Recent Labs Lab 06/27/17 1731 06/28/17 0351  NA 137 138  K 3.5 3.9  CL 107 109  CO2 23 23  GLUCOSE 126* 104*  BUN 11 11  CREATININE 0.83 0.72  CALCIUM 9.0 8.1*   GFR: Estimated Creatinine  Clearance: 91.8 mL/min (by C-G formula based on SCr of 0.72 mg/dL). Liver Function Tests:  Recent Labs Lab 06/27/17 1731  AST 32  ALT 30  ALKPHOS 40  BILITOT 0.3  PROT 6.5  ALBUMIN 4.1   No results for input(s): LIPASE, AMYLASE in the last 168 hours. No results for input(s): AMMONIA in the last 168 hours. Coagulation Profile: No results for input(s): INR, PROTIME in the last 168 hours. Cardiac Enzymes: No results for input(s): CKTOTAL, CKMB, CKMBINDEX, TROPONINI in the last 168 hours. BNP (last 3 results) No results for input(s): PROBNP in the last 8760 hours. HbA1C: No results for input(s): HGBA1C in the last 72  hours. CBG:  Recent Labs Lab 06/28/17 0747  GLUCAP 92   Lipid Profile: No results for input(s): CHOL, HDL, LDLCALC, TRIG, CHOLHDL, LDLDIRECT in the last 72 hours. Thyroid Function Tests: No results for input(s): TSH, T4TOTAL, FREET4, T3FREE, THYROIDAB in the last 72 hours. Anemia Panel: No results for input(s): VITAMINB12, FOLATE, FERRITIN, TIBC, IRON, RETICCTPCT in the last 72 hours. Urine analysis: No results found for: COLORURINE, APPEARANCEUR, LABSPEC, PHURINE, GLUCOSEU, HGBUR, BILIRUBINUR, KETONESUR, PROTEINUR, UROBILINOGEN, NITRITE, LEUKOCYTESUR Sepsis Labs: @LABRCNTIP (procalcitonin:4,lacticidven:4)  )No results found for this or any previous visit (from the past 240 hour(s)).    Studies: No results found.  Scheduled Meds: . pantoprazole  40 mg Intravenous Q24H    Continuous Infusions: . sodium chloride 75 mL/hr at 06/28/17 1341     LOS: 0 days     Desiree Hane, MD [facility] Pager [phone]  If 7PM-7AM, please contact night-coverage [email or web address] Password [email or web address] 06/28/2017, 1:57 PM

## 2017-06-28 NOTE — Consult Note (Addendum)
[facility] Consult: 10:10 AM 06/28/2017  LOS: 0 days    Referring Provider: Dr Oretha Milch.  ([name]) Primary Care Physician:  Cyndi Bender, PA-C Primary Gastroenterologist:  unassigned     Reason for Consultation:  Anemia.  FOBT +.     HPI: Leroy Horton is a 71 y.o. male.  PMH of GERD.  Previous remote EGD with esophageal dilatation at [facility] but no Epic records of this found.   In 12/2016  PMD replaced Omeprozole with Ranitidine 300 mg at HS which pt says controls GER sxs better.   Patient has declined/refused referral for colonoscopy as recommended by PMD  On Friday 10/19 felt weaker, dizzy, DOE.  Sat, Sunday, Monday: dark, formed stools.  Anorexia started Saturday.  No N/V or GI upset.  Takes 81 ASA, no NSAIDs.  1/5th [place] lasts him 7 to 10 days.  No hx anemia.   Went to PMD, and Hgb 6.4, c/w 15 a year ago (per pt report).  Sent to ED.       Hgb 6.7 >> 7.2 >> 6.8.  MCV 82.  BUN, platelets normal.  No coags.    S/p PRBC x 2.  First late PM of 10/22, and at 530 this AM.     Past Medical History:  Diagnosis Date  . Acid reflux   . High cholesterol   . Hypertension     Past Surgical History:  Procedure Laterality Date  . APPENDECTOMY    . HAND SURGERY      Prior to Admission medications   Medication Sig Start Date End Date Taking? Authorizing Provider  amLODipine (NORVASC) 10 MG tablet Take 10 mg by mouth daily. 05/20/17  Yes [provider]  fluticasone (FLONASE) 50 MCG/ACT nasal spray Place 2 sprays into both nostrils daily as needed for allergies.  06/16/17  Yes [provider]  losartan (COZAAR) 100 MG tablet Take 100 mg by mouth daily. 05/20/17  Yes [provider]  pravastatin (PRAVACHOL) 40 MG tablet Take 20 mg by mouth at bedtime.  03/22/17  Yes  [provider]  ranitidine (ZANTAC) 300 MG tablet Take 300 mg by mouth at bedtime.  06/14/17  Yes [provider]    Scheduled Meds: . pantoprazole  40 mg Intravenous Q24H   Infusions: . sodium chloride 75 mL/hr at 06/28/17 0006   PRN Meds: acetaminophen **OR** acetaminophen, hydrALAZINE, ondansetron **OR** ondansetron (ZOFRAN) IV   Allergies as of 06/27/2017 - Review Complete 06/27/2017  Allergen Reaction Noted  . Sulfa antibiotics  06/27/2017  . Sulfamethoxazole Rash 12/15/2015    Family History  Problem Relation Age of Onset  . Cancer Neg Hx     Social History   Social History  . Marital status: Married   Social History Main Topics  . Smoking status: Never Smoker  . Smokeless tobacco: Never Used  . Alcohol use Yes     Comment: occ  . Drug use: No   REVIEW OF SYSTEMS: Constitutional:  Per HPI ENT:  No nose bleeds Pulm:  DOE is new.  Normally he walks 3 miles a day without issue and climb stairs without a problem. CV:  No chest pain. In recent days has noticed some mild tachycardia.  no LE edema.  GU:  No hematuria, no frequency GI:  Reflux symptoms are well controlled. Little to no dysphagia. See HPI.   Heme:  No excessive bleeding or bruising.   Transfusions:  Before last night had never received transfusion with blood products. Neuro:  No headaches, no peripheral tingling or numbness Derm:  No itching, no rash or sores.  Endocrine:  No sweats or chills.  No polyuria or dysuria Immunization: reviewed immunization records from his PMD he had tetanus in 2012, Pneumovax in 2016.  Travel:  None beyond local counties in last few months.    PHYSICAL EXAM: Vital signs in last 24 hours: Vitals:   06/28/17 0553 06/28/17 0810  BP: (!) 123/58 133/67  Pulse: 74 74  Resp: 17   Temp: 98.4 F (36.9 C) 97.9 F (36.6 C)  SpO2: 100% 98%   Wt Readings from Last 3 Encounters:  06/27/17 83.6 kg (184 lb 4.9 oz)    General: pleasant, appropriate pale  but well appearing WM.   Head:  Facial swelling or asymmetry. No signs of head trauma.  Eyes:  No scleral icterus. Conjunctiva moderately pale. EOMI. Ears:  Not hard of hearing  Nose:  No discharge or congestion Mouth:  Moist, pink, clear oral mucosa. Dentition in good repair. Tongue midline. Neck:  No JVD, no bruits, no masses, no thyromegaly. Lungs:  No labored breathing or cough. Lungs clear to auscultation bilaterally. Heart: RRR. No MRG. S1, S2 present. Abdomen:  Soft. Not tender or distended. Active bowel sounds. No HSM, bruits, hernias..   Rectal: did not repeat rectal exam. This was done in the ED and stool was FOBT positive.  Stool itself was brown. No rectal masses, fissures, tears, hemorrhoids.   Musc/Skeltl: no joint swelling, redness.  Depruytrens contracture on the left hand Extremities:  No CCE.  Neurologic:  Alert. Oriented times 3. Good historian. Moves all 4 limbs. No tremors or gross deficits. Skin:  No rashes, no sores, no telangiectasia. Tattoos:  None observed Nodes:  o cervical or inguinal adenopathy.   Psych:  Fluid speech, calm, pleasant. Does not appear depressed or anxious.   LAB RESULTS:  Recent Labs  06/27/17 1731 06/27/17 2353 06/28/17 0351  WBC 9.0 10.0 8.3  HGB 6.7* 7.2* 6.8*  HCT 22.5* 22.9* 21.8*  PLT 341 295 286   BMET Lab Results  Component Value Date   NA 138 06/28/2017   NA 137 06/27/2017   K 3.9 06/28/2017   K 3.5 06/27/2017   CL 109 06/28/2017   CL 107 06/27/2017   CO2 23 06/28/2017   CO2 23 06/27/2017   GLUCOSE 104 (H) 06/28/2017   GLUCOSE 126 (H) 06/27/2017   BUN 11 06/28/2017   BUN 11 06/27/2017   CREATININE 0.72 06/28/2017   CREATININE 0.83 06/27/2017   CALCIUM 8.1 (L) 06/28/2017   CALCIUM 9.0 06/27/2017   LFT  Recent Labs  06/27/17 1731  PROT 6.5  ALBUMIN 4.1  AST 32  ALT 30  ALKPHOS 40  BILITOT 0.3        IMPRESSION:   *  Symptomatic anemia which seems to be acute blood loss anemia PTA; with dark  stools which tested FOBT positive.  R/o peptic ulcer disease versus AVMs versus neoplasia. Though BUN is normal, suspect upper GI bleed. Remote EGD with esophageal dilatation  and recent history of well-controlled GERD symptoms.  No previous colonoscopy. Daily 81 mg aspirin but no NSAID use. S/p PRBC x 2.      PLAN:     * EGD this afternoon.  If no source, will need colonoscopy.    *  Stopped gtt Protonix, switch to IV q day but let current freshly hung bag finish out.    Azucena Freed  06/28/2017, 10:10 AM Pager: [phone]      [facility] Attending   I have taken an interval history, reviewed the chart and examined the patient. I agree with the Advanced Practitioner's note, impression and recommendations.    The risks and benefits as well as alternatives of endoscopic procedure(s) have been discussed and reviewed. All questions answered. The patient agrees to proceed.  Gatha Mayer, MD, [facility] [phone] (pager) 06/28/2017 2:44 PM

## 2017-06-28 NOTE — Care Management Note (Signed)
Case Management Note  Patient Details  Name: Leroy Horton MRN: [id number] Date of Birth: January 01, 1946  Subjective/Objective:    Presents with GI bleed, hx of hypertension and hyperlipidemia and GERD. Resides with spouse. Independent with ADL's , no DME usage.    Stefen Juba     [phone]      PCP: Cyndi Bender  Action/Plan: GI following .... EGD this afternoon. CM following for disposition needs.  Expected Discharge Date:  06/30/17               Expected Discharge Plan:  Home/Self Care  In-House Referral:     Discharge planning Services  CM Consult  Post Acute Care Choice:    Choice offered to:     DME Arranged:    DME Agency:     HH Arranged:    HH Agency:     Status of Service:  In process, will continue to follow  If discussed at Long Length of Stay Meetings, dates discussed:    Additional Comments:  Sharin Mons, RN 06/28/2017, 11:04 AM

## 2017-06-29 DIAGNOSIS — D649 Anemia, unspecified: Secondary | ICD-10-CM

## 2017-06-29 DIAGNOSIS — K922 Gastrointestinal hemorrhage, unspecified: Secondary | ICD-10-CM

## 2017-06-29 DIAGNOSIS — K2981 Duodenitis with bleeding: Secondary | ICD-10-CM

## 2017-06-29 DIAGNOSIS — K257 Chronic gastric ulcer without hemorrhage or perforation: Secondary | ICD-10-CM

## 2017-06-29 LAB — CBC
HEMATOCRIT: 25.8 % — AB (ref 39.0–52.0)
HEMOGLOBIN: 7.9 g/dL — AB (ref 13.0–17.0)
MCH: 25.5 pg — ABNORMAL LOW (ref 26.0–34.0)
MCHC: 30.6 g/dL (ref 30.0–36.0)
MCV: 83.2 fL (ref 78.0–100.0)
Platelets: 325 10*3/uL (ref 150–400)
RBC: 3.1 MIL/uL — ABNORMAL LOW (ref 4.22–5.81)
RDW: 16.5 % — AB (ref 11.5–15.5)
WBC: 7.7 10*3/uL (ref 4.0–10.5)

## 2017-06-29 LAB — BPAM RBC
Blood Product Expiration Date: 201811082359
Blood Product Expiration Date: 201811142359
ISSUE DATE / TIME: 201810222116
ISSUE DATE / TIME: 201810230522
UNIT TYPE AND RH: 9500
Unit Type and Rh: 9500

## 2017-06-29 LAB — TYPE AND SCREEN
ABO/RH(D): O NEG
Antibody Screen: NEGATIVE
Unit division: 0
Unit division: 0

## 2017-06-29 LAB — CLOTEST (H. PYLORI), BIOPSY: Helicobacter screen: NEGATIVE

## 2017-06-29 LAB — GLUCOSE, CAPILLARY
Glucose-Capillary: 94 mg/dL (ref 65–99)
Glucose-Capillary: 85 mg/dL (ref 65–99)

## 2017-06-29 MED ORDER — PANTOPRAZOLE SODIUM 40 MG PO TBEC
40.0000 mg | DELAYED_RELEASE_TABLET | Freq: Every day | ORAL | 0 refills | Status: DC
Start: 2017-06-30 — End: 2017-07-19

## 2017-06-29 MED ORDER — FERROUS SULFATE 325 (65 FE) MG PO TABS
325.0000 mg | ORAL_TABLET | Freq: Two times a day (BID) | ORAL | Status: DC
  Administered 2017-06-29: 325 mg via ORAL
  Filled 2017-06-29: qty 1

## 2017-06-29 MED ORDER — FERROUS SULFATE 325 (65 FE) MG PO TABS: 325.0000 mg | ORAL_TABLET | Freq: Two times a day (BID) | ORAL | Status: DC

## 2017-06-29 MED ORDER — FERROUS SULFATE 325 (65 FE) MG PO TABS: 325.0000 mg | ORAL_TABLET | Freq: Two times a day (BID) | ORAL | 0 refills | Status: DC

## 2017-06-29 NOTE — Consult Note (Signed)
           [facility] CM Primary Care Navigator  06/29/2017  MAN EFFERTZ 1946-06-21 [id number]   Attemptto seepatient at the bedside to identify possible discharge needs but was unable to due to writer's injury. Patient was discharged home today .  Primary care provider's office is listed as doing transition of care (TOC).  For questions, please contact:  Dannielle Huh, BSN, RN- [facility] Primary Care Navigator  Telephone: [phone] [place]

## 2017-06-29 NOTE — Progress Notes (Signed)
Leroy Horton to be D/C'd to home per MD order.  Discussed with the patient and all questions fully answered.  VSS, Skin clean, dry and intact without evidence of skin break down, no evidence of skin tears noted. IV catheter discontinued intact. Site without signs and symptoms of complications. Dressing and pressure applied.  An After Visit Summary was printed and given to the patient. Patient  Prescriptions sent to Pharmacy.  D/c education completed with patient/family including follow up instructions, medication list, d/c activities limitations if indicated, with other d/c instructions as indicated by MD - patient able to verbalize understanding, all questions fully answered.   Patient instructed to return to ED, call 911, or call MD for any changes in condition.   Patient escorted via [place], and D/C home via private auto.  Morley Kos Price 06/29/2017 12:08 PM

## 2017-06-29 NOTE — Progress Notes (Signed)
          Daily Rounding Note  06/29/2017, 10:43 AM  LOS: 1 day   SUBJECTIVE:   Stool brown.  No n/v or abd pain.  Not dizzy.  Feels better   OBJECTIVE:         Vital signs in last 24 hours:    Temp:  [98 F (36.7 C)-98.4 F (36.9 C)] 98.2 F (36.8 C) (10/24 0454) Pulse Rate:  [78-106] 78 (10/24 0454) Resp:  [10-22] 18 (10/24 0454) BP: (129-171)/(60-78) 131/78 (10/24 0454) SpO2:  [93 %-99 %] 95 % (10/24 0454) Last BM Date: 06/27/17 Filed Weights   06/27/17 2351  Weight: 83.6 kg (184 lb 4.9 oz)   General: looks well, alert and comfortable   Heart: RRR Chest: clear Abdomen: soft, NT.  Active BS  Extremities: no CCE Neuro/Psych:  Oriented x 3.  No deficits.    Lab Results:  Recent Labs  06/28/17 0351 06/28/17 1112 06/29/17 0523  WBC 8.3 9.5 7.7  HGB 6.8* 8.2* 7.9*  HCT 21.8* 26.1* 25.8*  PLT 286 293 325   BMET  Recent Labs  06/27/17 1731 06/28/17 0351  NA 137 138  K 3.5 3.9  CL 107 109  CO2 23 23  GLUCOSE 126* 104*  BUN 11 11  CREATININE 0.83 0.72  CALCIUM 9.0 8.1*   LFT  Recent Labs  06/27/17 1731  PROT 6.5  ALBUMIN 4.1  AST 32  ALT 30  ALKPHOS 40  BILITOT 0.3     ASSESMENT:   *  Symptomatic anemia.  Dark, FOBT positive stool.  S/p PRBC x 2.   06/28/17 EGD.  Hiatal hernia.  Multiple, small, nonbleeding gastric erosions with stigmata of recent bleeding.  Multiple non-bleeding, duodenal ulcers.  Biopsies pending.  Taking 81 ASA but no NSAIDs at home. On daily PPI.  oral iron initiated and dosed with Feraheme yesterday.  However there is no migraine cytosis and iron studies have not been obtained. Hgb stable.     PLAN   *  Await pathology report.  Treat if H. pylori positive.   Continue Protonix in AM and HS h2 blocker: Ranitidine 300 mg at HS.     Elevate Head of Bead.    *Dr. Carlean Purl suggested he consider elective hiatal hernia repair. Has GI ROV on 11/13.      Leroy Horton   06/29/2017, 10:43 AM Pager: [phone]  Agree with Ms. Leroy Horton assessment and plan. Gatha Mayer, MD, [facility]

## 2017-07-01 NOTE — Progress Notes (Signed)
Did not have H pylori infection in stomach Hope he is doing better Will see him 11/13

## 2017-07-02 NOTE — Discharge Summary (Addendum)
[facility] Discharge Summary   Patient: Leroy Horton BZJ:[id number]   PCP: Cyndi Bender, PA-C DOB: [birth year]   Date of admission: 06/27/2017   Date of discharge: 06/29/2017    Discharge Diagnoses:  Principal Problem:   Acute GI bleeding Active Problems:   Acute blood loss anemia   GERD (gastroesophageal reflux disease)   Essential hypertension   Symptomatic anemia   Heme positive stool   Lysbeth Galas lesion, chronic   Multiple duodenal ulcers   Hiatal hernia - 10 cm w/ Lysbeth Galas erosions   Admitted From: home Disposition:  home  Recommendations for Outpatient Follow-up:  1. Follow up with gastroenterology as recommended  2. PCP with 1 week with CBC   Follow-up Information    Gatha Mayer, MD Follow up on 07/19/2017.   Specialty:  Gastroenterology Why:  330 PM follow up with GI doc.   Contact information: [address] [place] [address] [phone]        Cyndi Bender, PA-C. Schedule an appointment as soon as possible for a visit in 1 week(s).   Specialty:  Physician Assistant Why:  with CBC.  Contact information: [place] [address] [phone]          Diet recommendation: cardiac diet  Activity: The patient is advised to gradually reintroduce usual activities.  Discharge Condition: good  Code Status: full code  History of present illness: As per the H and P dictated on admission, "Leroy DEGRAZIA is a 71 y.o. male with history of hypertension and hyperlipidemia and GERD has been experiencing weakness and fatigue over the last 4-5 days.  Patient had followed up with his primary care physician today and was found to have a hemoglobin around 6.  Patient also has been noticing some black stools.  Patient takes ibuprofen off and on for pain.  Patient was referred to the ER for symptomatic anemia likely from GI bleed.  ED Course: In the ER patient was stool for occult blood positive.  Hemoglobin was around 6 and 1 unit of  PRBC was ordered.  Patient was started on Protonix.  Admitted for GI bleeding.  Patient states he has never had any EGD or colonoscopy previously."  Hospital Course:  Summary of his active problems in the hospital is as following. Symptomatic anemia Iron deficiency  Given age and no prior colonoscopy concern for colon cancer. Questionable melanotic stool per report but none on DRE. Current hemoglobin stable after 1 unit transfusion GI consulted, appreciate recs Given IV Protonix twice daily, will d/c on PPI and h2 blocker Continue iron supplement   GERD Patient on Zantac as outpatient  Hypertension Has had no episodes of hypotension during hospital stay Resume home meds  Hyperlipidemia. Continuing Pravachol at home.  All other chronic medical condition were stable during the hospitalization.  Patient was ambulatory without any assistance. On the day of the discharge the patient's vitals were stable, and no other acute medical condition were reported by patient. the patient was felt safe to be discharge at home with gamily.  Procedures and Results:  EGD  Impression:               - Non-bleeding erosive gastropathy.                           - 10 cm hiatal hernia.                           -  Multiple non-bleeding duodenal ulcers.                           - The examination was otherwise normal.                           - Biopsies were taken with a cold forceps for                            Helicobacter pylori testing using CLOtest. Consultations:  Gastroenterology   DISCHARGE MEDICATION: Discharge Medication List as of 06/29/2017 11:58 AM    START taking these medications   Details  ferrous sulfate 325 (65 FE) MG tablet Take 1 tablet (325 mg total) by mouth 2 (two) times daily with a meal., Starting Wed 06/29/2017, Normal    pantoprazole (PROTONIX) 40 MG tablet Take 1 tablet (40 mg total) by mouth daily before breakfast., Starting Thu 06/30/2017, Normal        CONTINUE these medications which have NOT CHANGED   Details  amLODipine (NORVASC) 10 MG tablet Take 10 mg by mouth daily., Starting Fri 05/20/2017, Historical Med    fluticasone (FLONASE) 50 MCG/ACT nasal spray Place 2 sprays into both nostrils daily as needed for allergies. , Starting Thu 06/16/2017, Historical Med    losartan (COZAAR) 100 MG tablet Take 100 mg by mouth daily., Starting Fri 05/20/2017, Historical Med    pravastatin (PRAVACHOL) 40 MG tablet Take 20 mg by mouth at bedtime. , Starting Tue 03/22/2017, Historical Med    ranitidine (ZANTAC) 300 MG tablet Take 300 mg by mouth at bedtime. , Starting Tue 06/14/2017, Historical Med       Allergies  Allergen Reactions  . Sulfa Antibiotics   . Sulfamethoxazole Rash   Discharge Instructions    Diet - low sodium heart healthy    Complete by:  As directed    Discharge instructions    Complete by:  As directed    It is important that you read following instructions as well as go over your medication list with RN to help you understand your care after this hospitalization.  Discharge Instructions: Please follow-up with PCP in one week  Please request your primary care physician to go over all Hospital Tests and Procedure/Radiological results at the follow up,  Please get all Hospital records sent to your PCP by signing hospital release before you go home.   Do not take more than prescribed Pain, Sleep and Anxiety Medications. You were cared for by a hospitalist during your hospital stay. If you have any questions about your discharge medications or the care you received while you were in the hospital after you are discharged, you can call the unit and ask to speak with the hospitalist on call if the hospitalist that took care of you is not available.  Once you are discharged, your primary care physician will handle any further medical issues. Please note that NO REFILLS for any discharge medications will be authorized once you are  discharged, as it is imperative that you return to your primary care physician (or establish a relationship with a primary care physician if you do not have one) for your aftercare needs so that they can reassess your need for medications and monitor your lab values. You Must read complete instructions/literature along with all the possible adverse reactions/side effects for all the Medicines you take and  that have been prescribed to you. Take any new Medicines after you have completely understood and accept all the possible adverse reactions/side effects. Wear Seat belts while driving. If you have smoked or chewed Tobacco in the last 2 yrs please stop smoking and/or stop any Recreational drug use.   Increase activity slowly    Complete by:  As directed      Discharge Exam: Filed Weights   06/27/17 2351  Weight: 83.6 kg (184 lb 4.9 oz)   Vitals:   06/28/17 2039 06/29/17 0454  BP: (!) 148/73 131/78  Pulse: 78 78  Resp: 18 18  Temp: 98 F (36.7 C) 98.2 F (36.8 C)  SpO2: 97% 95%   General: Appear in no distress, no Rash; Oral Mucosa moist. Cardiovascular: S1 and S2 Present, no Murmur, no JVD Respiratory: Bilateral Air entry present and Clear to Auscultation, no Crackles, no wheezes Abdomen: Bowel Sound present, Soft and no tenderness Extremities: no Pedal edema, no calf tenderness Neurology: Grossly no focal neuro deficit.  The results of significant diagnostics from this hospitalization (including imaging, microbiology, ancillary and laboratory) are listed below for reference.    Significant Diagnostic Studies: No results found.  Microbiology: No results found for this or any previous visit (from the past 240 hour(s)).   Labs: CBC:  Recent Labs Lab 06/27/17 1731 06/27/17 2353 06/28/17 0351 06/28/17 1112 06/29/17 0523  WBC 9.0 10.0 8.3 9.5 7.7  HGB 6.7* 7.2* 6.8* 8.2* 7.9*  HCT 22.5* 22.9* 21.8* 26.1* 25.8*  MCV 82.4 83.3 82.6 82.6 83.2  PLT 341 295 286 293 563    Basic Metabolic Panel:  Recent Labs Lab 06/27/17 1731 06/28/17 0351  NA 137 138  K 3.5 3.9  CL 107 109  CO2 23 23  GLUCOSE 126* 104*  BUN 11 11  CREATININE 0.83 0.72  CALCIUM 9.0 8.1*   Liver Function Tests:  Recent Labs Lab 06/27/17 1731  AST 32  ALT 30  ALKPHOS 40  BILITOT 0.3  PROT 6.5  ALBUMIN 4.1   No results for input(s): LIPASE, AMYLASE in the last 168 hours. No results for input(s): AMMONIA in the last 168 hours. Cardiac Enzymes: No results for input(s): CKTOTAL, CKMB, CKMBINDEX, TROPONINI in the last 168 hours. BNP (last 3 results) No results for input(s): BNP in the last 8760 hours. CBG:  Recent Labs Lab 06/28/17 0747 06/28/17 1557 06/29/17 0453 06/29/17 0738  GLUCAP 92 96 94 85   Time spent: 35 minutes  Signed:  Berle Mull  [facility] 06/29/2017 , 5:05 PM

## 2017-07-04 DIAGNOSIS — Z125 Encounter for screening for malignant neoplasm of prostate: Secondary | ICD-10-CM | POA: Diagnosis not present

## 2017-07-04 DIAGNOSIS — D500 Iron deficiency anemia secondary to blood loss (chronic): Secondary | ICD-10-CM | POA: Diagnosis not present

## 2017-07-04 DIAGNOSIS — I10 Essential (primary) hypertension: Secondary | ICD-10-CM | POA: Diagnosis not present

## 2017-07-04 DIAGNOSIS — Z1331 Encounter for screening for depression: Secondary | ICD-10-CM | POA: Diagnosis not present

## 2017-07-04 DIAGNOSIS — E7800 Pure hypercholesterolemia, unspecified: Secondary | ICD-10-CM | POA: Diagnosis not present

## 2017-07-04 DIAGNOSIS — Z0000 Encounter for general adult medical examination without abnormal findings: Secondary | ICD-10-CM | POA: Diagnosis not present

## 2017-07-19 ENCOUNTER — Ambulatory Visit: Payer: Medicare Other | Admitting: Internal Medicine

## 2017-07-19 ENCOUNTER — Other Ambulatory Visit (INDEPENDENT_AMBULATORY_CARE_PROVIDER_SITE_OTHER): Payer: Medicare Other

## 2017-07-19 ENCOUNTER — Encounter: Payer: Self-pay | Admitting: Internal Medicine

## 2017-07-19 VITALS — BP 146/84 | HR 86 | Ht 67.5 in | Wt 187.0 lb

## 2017-07-19 DIAGNOSIS — K2981 Duodenitis with bleeding: Secondary | ICD-10-CM

## 2017-07-19 DIAGNOSIS — K449 Diaphragmatic hernia without obstruction or gangrene: Secondary | ICD-10-CM | POA: Diagnosis not present

## 2017-07-19 DIAGNOSIS — K257 Chronic gastric ulcer without hemorrhage or perforation: Secondary | ICD-10-CM | POA: Diagnosis not present

## 2017-07-19 DIAGNOSIS — K269 Duodenal ulcer, unspecified as acute or chronic, without hemorrhage or perforation: Secondary | ICD-10-CM

## 2017-07-19 DIAGNOSIS — D62 Acute posthemorrhagic anemia: Secondary | ICD-10-CM

## 2017-07-19 LAB — CBC WITH DIFFERENTIAL/PLATELET
Basophils Absolute: 0.1 10*3/uL (ref 0.0–0.1)
Basophils Relative: 0.8 % (ref 0.0–3.0)
EOS ABS: 0.1 10*3/uL (ref 0.0–0.7)
EOS PCT: 1.7 % (ref 0.0–5.0)
HCT: 41.4 % (ref 39.0–52.0)
Hemoglobin: 13.3 g/dL (ref 13.0–17.0)
LYMPHS ABS: 1.7 10*3/uL (ref 0.7–4.0)
Lymphocytes Relative: 24.5 % (ref 12.0–46.0)
MCHC: 32.2 g/dL (ref 30.0–36.0)
MCV: 92.5 fl (ref 78.0–100.0)
MONO ABS: 0.6 10*3/uL (ref 0.1–1.0)
Monocytes Relative: 8.5 % (ref 3.0–12.0)
NEUTROS PCT: 64.5 % (ref 43.0–77.0)
Neutro Abs: 4.6 10*3/uL (ref 1.4–7.7)
Platelets: 281 10*3/uL (ref 150.0–400.0)
RBC: 4.47 Mil/uL (ref 4.22–5.81)
RDW: 25.6 % — ABNORMAL HIGH (ref 11.5–15.5)
WBC: 7.1 10*3/uL (ref 4.0–10.5)

## 2017-07-19 MED ORDER — PANTOPRAZOLE SODIUM 40 MG PO TBEC: 40.0000 mg | DELAYED_RELEASE_TABLET | Freq: Every day | ORAL | 3 refills | Status: DC

## 2017-07-19 MED ORDER — FERROUS SULFATE 325 (65 FE) MG PO TABS: 325.0000 mg | ORAL_TABLET | Freq: Two times a day (BID) | ORAL | 3 refills | Status: DC

## 2017-07-19 NOTE — Progress Notes (Signed)
Leroy Horton 71 y.o. 1946-01-29 [id number]  Assessment & Plan:   Encounter Diagnoses  Name Primary?  Lysbeth Galas lesion, chronic   . Hiatal hernia - 10 cm w/ Lysbeth Galas erosions   . Multiple duodenal ulcers   . Acute blood loss anemia Yes   He will continue his PPI and ferrous sulfate.  We will recheck a hemoglobin today with a CBC.  Upper GI series to evaluate the anatomy.  He prefers not to have surgery for his hiatal hernia, if there is a paraesophageal component that might force the issue.  I do have some question as to whether or not he had some sort of torsion of the stomach that led to the duodenal lesions that were seen or if that is a separate issue.  He will need to stay on lifelong iron.   We discussed having a screening colonoscopy.  His wife is a Pharmacist, hospital and he will look into when she might have time off after the first of the year to where we could to schedule this elective screening procedure.  I appreciate the opportunity to care for this patient. CC: Cyndi Bender, PA-C    Subjective:   Chief Complaint: Hospital follow-up after GI bleed  HPI The patient is here for follow-up, I met him in the hospital last month he presented with melena and had a very large hiatal hernia with Cameron's erosions as well as multiple duodenal ulcers.  He was transfused given 1 dose of Feraheme and felt better.  Treated with PPI.  CLOtest biopsy negative for H. pylori.  He has seen primary care and said his hemoglobin was about 10 in follow-up a week after hospitalization, it was 7.9 on 1024.  He has never had a screening colonoscopy and is willing to do so. Allergies  Allergen Reactions  . Sulfa Antibiotics   . Sulfamethoxazole Rash   Current Meds  Medication Sig  . amLODipine (NORVASC) 10 MG tablet Take 10 mg by mouth daily.  . ferrous sulfate 325 (65 FE) MG tablet Take 1 tablet (325 mg total) by mouth 2 (two) times daily with a meal.  . fluticasone (FLONASE) 50 MCG/ACT nasal  spray Place 2 sprays into both nostrils daily as needed for allergies.   Marland Kitchen losartan (COZAAR) 100 MG tablet Take 100 mg by mouth daily.  . pantoprazole (PROTONIX) 40 MG tablet Take 1 tablet (40 mg total) by mouth daily before breakfast.  . pravastatin (PRAVACHOL) 40 MG tablet Take 20 mg by mouth at bedtime.   . ranitidine (ZANTAC) 300 MG tablet Take 300 mg by mouth at bedtime.    Past Medical History:  Diagnosis Date  . Acid reflux   . Duodenal ulcer   . Hiatal hernia - 10 cm w/ Lysbeth Galas erosions 06/28/2017  . High cholesterol   . Hypertension    Past Surgical History:  Procedure Laterality Date  . APPENDECTOMY    . HAND SURGERY     Social History   Socioeconomic History  . Marital status: Married    Spouse name: Mardene Celeste                                    Occupational History  . Occupation: retired    Comment: Acupuncturist  Tobacco Use  . Smoking status: Never Smoker  . Smokeless tobacco: Never Used  Substance and Sexual Activity  . Alcohol use: Yes    Comment:  occ  . Drug use: No  .    Other Topics   .   Social History Narrative   Retired from [employer]   Wife, Hospital doctor, teaches at [employer] [place] As above  Objective:   Physical Exam BP (!) 146/84   Pulse 86   Ht 5' 7.5" (1.715 m)   Wt 187 lb (84.8 kg)   BMI 28.86 kg/m  No acute distress  15 minutes time spent with patient > half in counseling coordination of care

## 2017-07-19 NOTE — Assessment & Plan Note (Signed)
UGI series Stay on PPI

## 2017-07-19 NOTE — Assessment & Plan Note (Signed)
UGI series to evaluate for paraesophageal component

## 2017-07-19 NOTE — Assessment & Plan Note (Signed)
Stay on PPI Consider stop and H pylori check

## 2017-07-19 NOTE — Patient Instructions (Addendum)
  Your physician has requested that you go to the basement for the following lab work before leaving today: CBC/diff  It has been recommended to you by your physician that you have a(n) colonoscopy completed. Per your request, we did not schedule the procedure(s) today. Please contact our office at [phone] should you decide to have the procedure completed.  We have sent the following medications to your pharmacy for you to pick up at your convenience: Iron, pantoprazole    You have been scheduled for an Upper GI Series at [facility]. Your appointment is on 08/03/17 at 11:00AM. Please arrive 15 minutes prior to your test for registration. Make sure not to eat or drink anything after midnight on the night before your test. If you need to reschedule, please call radiology at [phone]. ________________________________________________________________ An upper GI series uses x rays to help diagnose problems of the upper GI tract, which includes the esophagus, stomach, and duodenum. The duodenum is the first part of the small intestine. An upper GI series is conducted by a radiology technologist or a radiologist-a doctor who specializes in x-ray imaging-at a hospital or outpatient center. While sitting or standing in front of an x-ray machine, the patient drinks barium liquid, which is often white and has a chalky consistency and taste. The barium liquid coats the lining of the upper GI tract and makes signs of disease show up more clearly on x rays. X-ray video, called fluoroscopy, is used to view the barium liquid moving through the esophagus, stomach, and duodenum. Additional x rays and fluoroscopy are performed while the patient lies on an x-ray table. To fully coat the upper GI tract with barium liquid, the technologist or radiologist may press on the abdomen or ask the patient to change position. Patients hold still in various positions, allowing the technologist or radiologist to  take x rays of the upper GI tract at different angles. If a technologist conducts the upper GI series, a radiologist will later examine the images to look for problems.  This test typically takes about 1 hour to complete. __________________________________________________________________  I appreciate the opportunity to care for you.

## 2017-08-03 ENCOUNTER — Ambulatory Visit (HOSPITAL_COMMUNITY)
Admission: RE | Admit: 2017-08-03 | Discharge: 2017-08-03 | Disposition: A | Discharge status: Home / Self Care | Payer: Medicare Other | Source: Ambulatory Visit | Attending: Internal Medicine | Admitting: Internal Medicine

## 2017-08-03 DIAGNOSIS — K449 Diaphragmatic hernia without obstruction or gangrene: Secondary | ICD-10-CM

## 2017-08-03 NOTE — Progress Notes (Signed)
Large hiatal hernia No signs of volvulus My Chart note

## 2017-08-04 DIAGNOSIS — D500 Iron deficiency anemia secondary to blood loss (chronic): Secondary | ICD-10-CM | POA: Diagnosis not present

## 2017-08-04 DIAGNOSIS — E7800 Pure hypercholesterolemia, unspecified: Secondary | ICD-10-CM | POA: Diagnosis not present

## 2017-11-21 DIAGNOSIS — L570 Actinic keratosis: Secondary | ICD-10-CM | POA: Diagnosis not present

## 2017-12-07 ENCOUNTER — Other Ambulatory Visit: Payer: Self-pay

## 2017-12-07 MED ORDER — PRAVASTATIN SODIUM 40 MG PO TABS: 40.0000 mg | ORAL_TABLET | Freq: Every day | ORAL | 3 refills | Status: DC

## 2017-12-07 NOTE — Telephone Encounter (Signed)
Pantoprazole refilled as [facility] requested. 

## 2017-12-08 ENCOUNTER — Other Ambulatory Visit: Payer: Self-pay

## 2017-12-08 MED ORDER — PANTOPRAZOLE SODIUM 40 MG PO TBEC: 40.0000 mg | DELAYED_RELEASE_TABLET | Freq: Every day | ORAL | 3 refills | Status: DC

## 2017-12-08 NOTE — Telephone Encounter (Signed)
Yesterdays refill must not have gone thru so resent it today to the [facility] as they sent a 2nd attempt.

## 2018-01-02 DIAGNOSIS — K2981 Duodenitis with bleeding: Secondary | ICD-10-CM | POA: Diagnosis not present

## 2018-01-02 DIAGNOSIS — D500 Iron deficiency anemia secondary to blood loss (chronic): Secondary | ICD-10-CM | POA: Diagnosis not present

## 2018-01-02 DIAGNOSIS — I10 Essential (primary) hypertension: Secondary | ICD-10-CM | POA: Diagnosis not present

## 2018-01-02 DIAGNOSIS — Z9181 History of falling: Secondary | ICD-10-CM | POA: Diagnosis not present

## 2018-01-02 DIAGNOSIS — E7800 Pure hypercholesterolemia, unspecified: Secondary | ICD-10-CM | POA: Diagnosis not present

## 2018-04-13 DIAGNOSIS — H524 Presbyopia: Secondary | ICD-10-CM | POA: Diagnosis not present

## 2018-05-23 DIAGNOSIS — L570 Actinic keratosis: Secondary | ICD-10-CM | POA: Diagnosis not present

## 2018-07-06 DIAGNOSIS — D500 Iron deficiency anemia secondary to blood loss (chronic): Secondary | ICD-10-CM | POA: Diagnosis not present

## 2018-07-06 DIAGNOSIS — Z23 Encounter for immunization: Secondary | ICD-10-CM | POA: Diagnosis not present

## 2018-07-06 DIAGNOSIS — I10 Essential (primary) hypertension: Secondary | ICD-10-CM | POA: Diagnosis not present

## 2018-07-06 DIAGNOSIS — E7800 Pure hypercholesterolemia, unspecified: Secondary | ICD-10-CM | POA: Diagnosis not present

## 2018-07-06 DIAGNOSIS — K2981 Duodenitis with bleeding: Secondary | ICD-10-CM | POA: Diagnosis not present

## 2019-01-05 DIAGNOSIS — E7800 Pure hypercholesterolemia, unspecified: Secondary | ICD-10-CM | POA: Diagnosis not present

## 2019-01-05 DIAGNOSIS — K449 Diaphragmatic hernia without obstruction or gangrene: Secondary | ICD-10-CM | POA: Diagnosis not present

## 2019-01-05 DIAGNOSIS — I10 Essential (primary) hypertension: Secondary | ICD-10-CM | POA: Diagnosis not present

## 2019-01-05 DIAGNOSIS — K2981 Duodenitis with bleeding: Secondary | ICD-10-CM | POA: Diagnosis not present

## 2019-02-16 DIAGNOSIS — K2981 Duodenitis with bleeding: Secondary | ICD-10-CM | POA: Diagnosis not present

## 2019-02-16 DIAGNOSIS — I10 Essential (primary) hypertension: Secondary | ICD-10-CM | POA: Diagnosis not present

## 2019-02-16 DIAGNOSIS — E7800 Pure hypercholesterolemia, unspecified: Secondary | ICD-10-CM | POA: Diagnosis not present

## 2019-04-17 DIAGNOSIS — Z961 Presence of intraocular lens: Secondary | ICD-10-CM | POA: Diagnosis not present

## 2019-04-17 DIAGNOSIS — H524 Presbyopia: Secondary | ICD-10-CM | POA: Diagnosis not present

## 2019-05-03 DIAGNOSIS — Z0000 Encounter for general adult medical examination without abnormal findings: Secondary | ICD-10-CM | POA: Diagnosis not present

## 2019-05-03 DIAGNOSIS — Z9181 History of falling: Secondary | ICD-10-CM | POA: Diagnosis not present

## 2019-05-03 DIAGNOSIS — Z1231 Encounter for screening mammogram for malignant neoplasm of breast: Secondary | ICD-10-CM | POA: Diagnosis not present

## 2019-05-03 DIAGNOSIS — E785 Hyperlipidemia, unspecified: Secondary | ICD-10-CM | POA: Diagnosis not present

## 2019-05-22 DIAGNOSIS — L570 Actinic keratosis: Secondary | ICD-10-CM | POA: Diagnosis not present

## 2019-06-03 IMAGING — RF DG UGI W/ KUB
12 of 13 series · 15 of 18 positions shown · non-contrast
Comparison: None.

CLINICAL DATA: Hiatal hernia with erosions and duodenal ulcers.
Question torsion.

EXAM:
UPPER GI SERIES WITH KUB
TECHNIQUE: After obtaining a scout radiograph a routine upper GI series was
performed using thin and high density barium.
FLUOROSCOPY TIME:  Fluoroscopy Time:  2 minutes 36 seconds
Radiation Exposure Index (if provided by the fluoroscopic device):
63 mGy
Number of Acquired Spot Images: 7

[Series 1: t abdomen supine · 0.15mm/px · 1 of 1 slices shown]
[im 1/1]
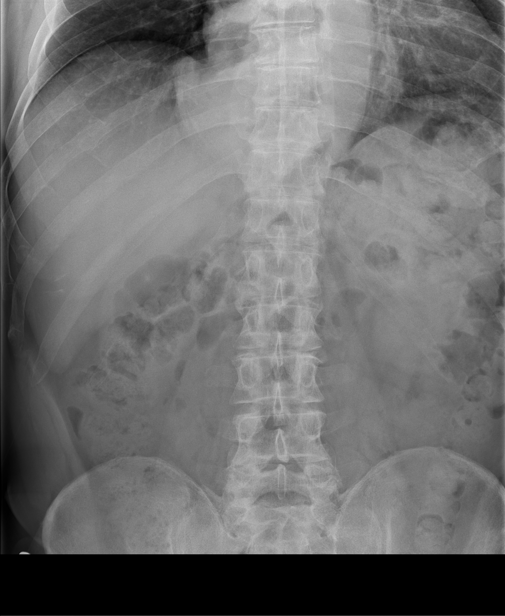

[Series 2: fluoro_barium 2fps_bw · 0.20mm/px · 1 of 2 frames shown (1 of 6)]
[frame 1/2]
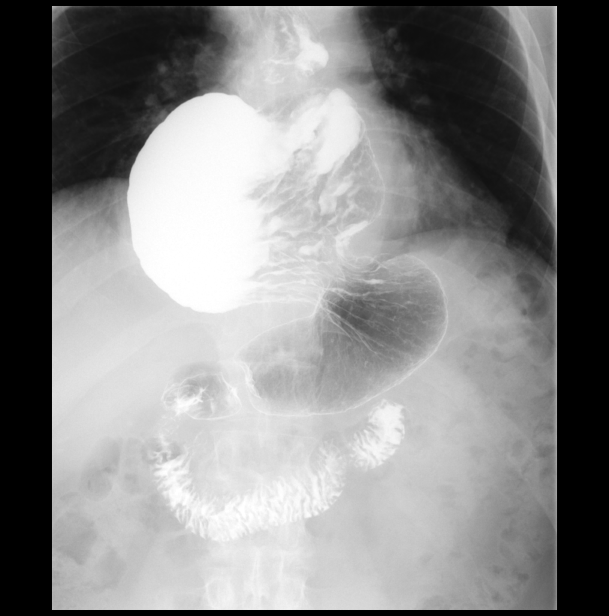

[Series 3: fluoro_barium 2fps_bw · 0.19mm/px · 2 of 2 frames shown (2 of 6)]
[frame 1/2]
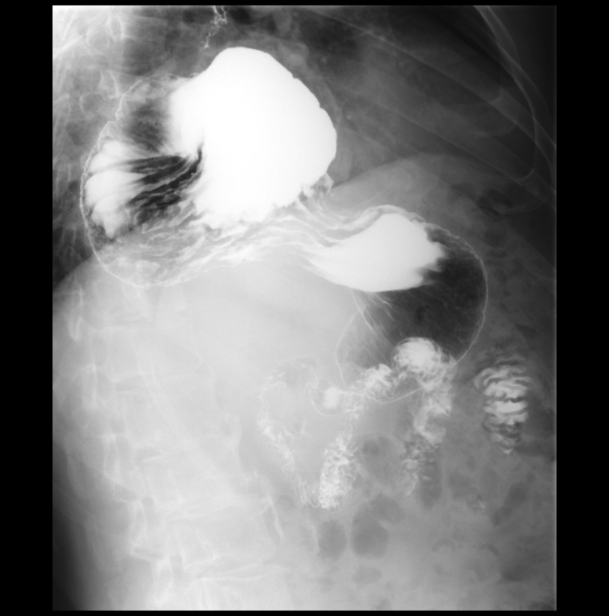
[frame 2/2]
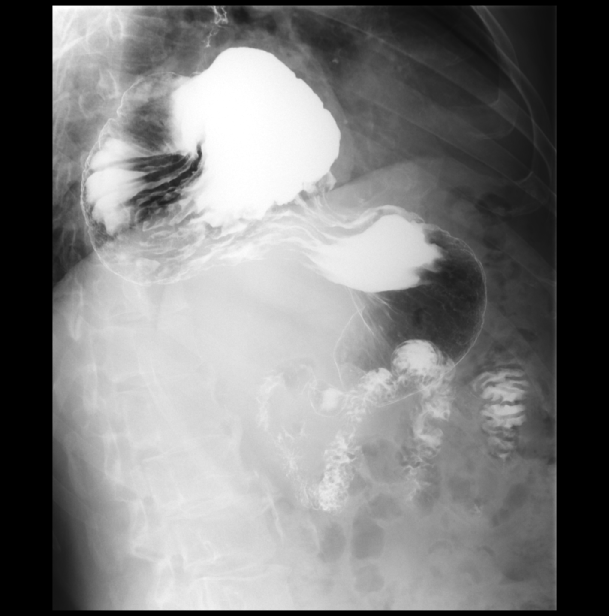

[Series 4: fluoro_barium 2fps_bw · 0.20mm/px · 1 of 1 slices shown (3 of 6)]
[im 1/1]
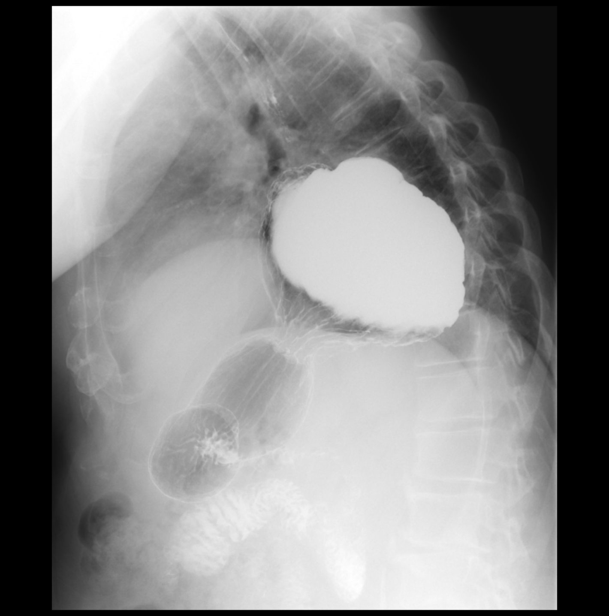

[Series 5: fluoro_barium 2fps_bw · 0.19mm/px · 1 of 1 slices shown (4 of 6)]
[im 1/1]
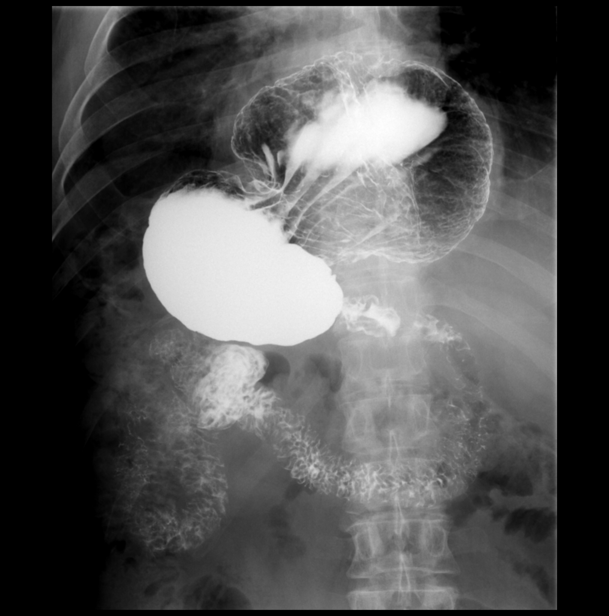

[Series 6: cp_standard · 0.28mm/px · 1 of 1 slices shown (1 of 5)]
[im 1/1]
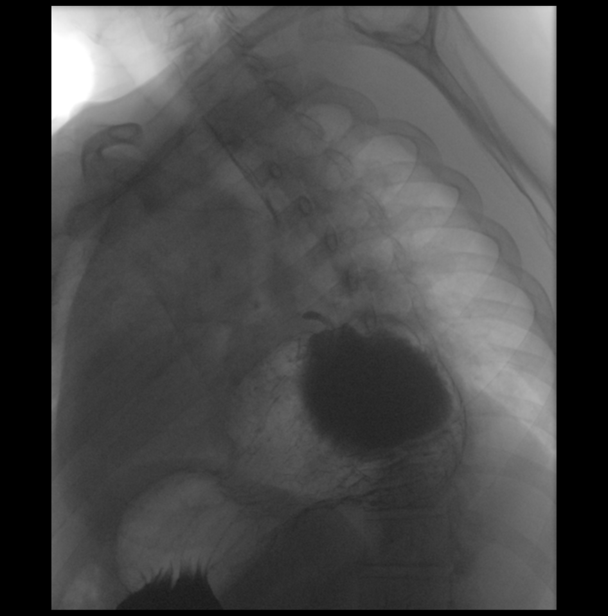

[Series 8: fluoro_barium 2fps_bw · 0.19mm/px · 1 of 1 slices shown (5 of 6)]
[im 1/1]
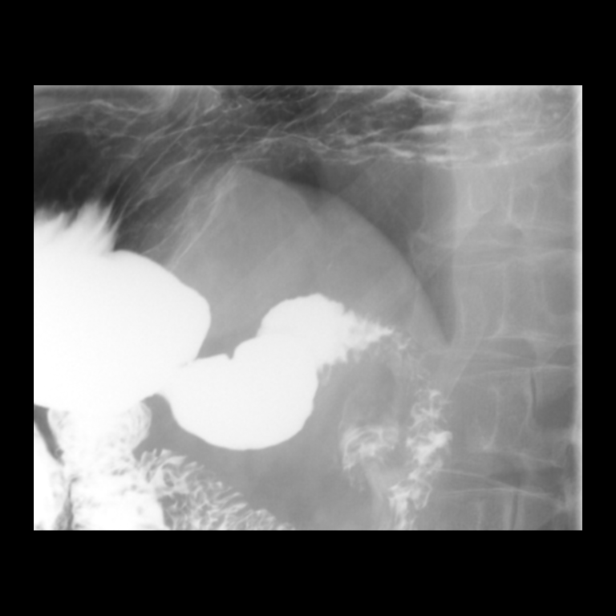

[Series 9: cp_standard · 0.19mm/px · 1 of 1 slices shown (2 of 5)]
[im 1/1]
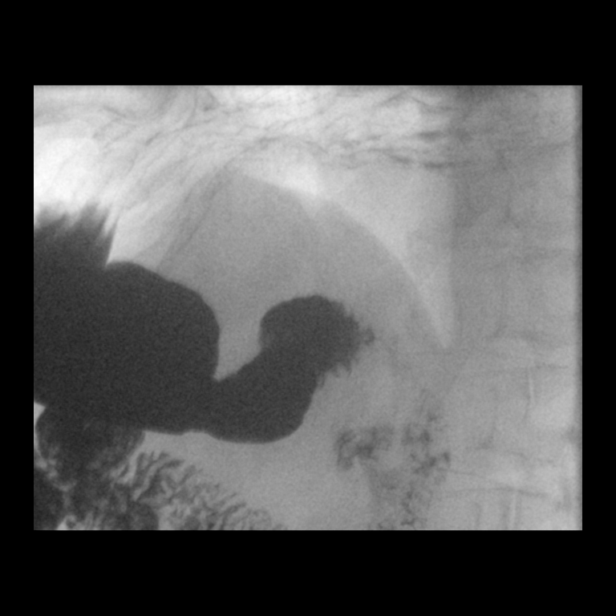

[Series 10: cp_standard · 0.18mm/px · 1 of 1 slices shown (3 of 5)]
[im 1/1]
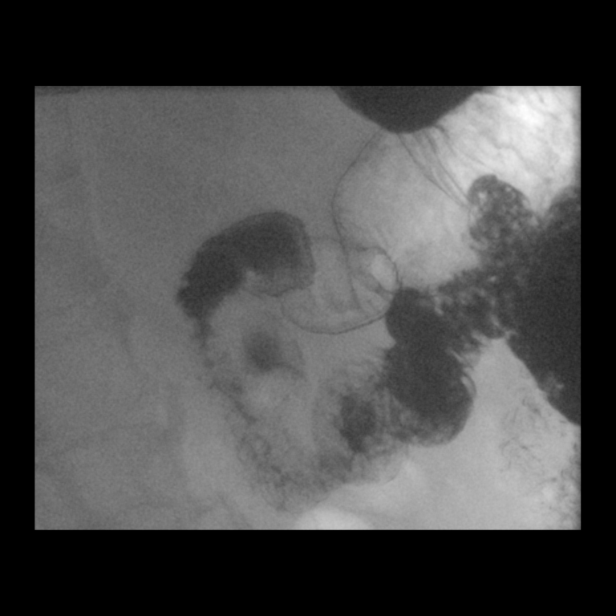

[Series 11: fluoro_barium 2fps_bw · 0.18mm/px · 1 of 1 slices shown (6 of 6)]
[im 1/1]
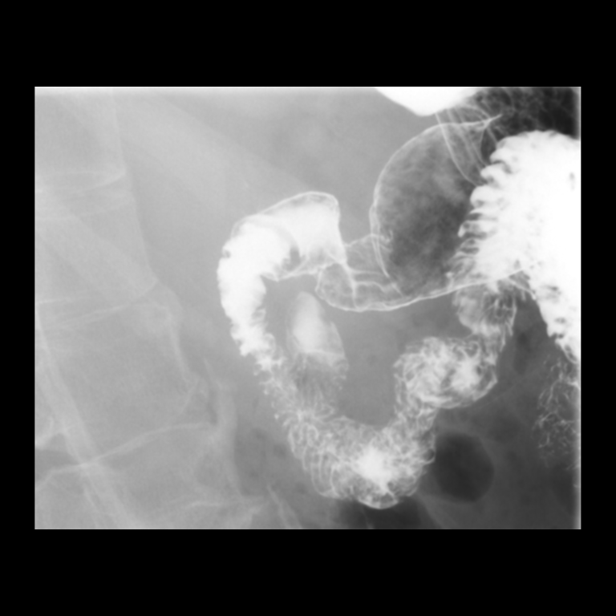

[Series 12: cp_standard · 0.18mm/px · 1 of 1 slices shown (4 of 5)]
[im 1/1]
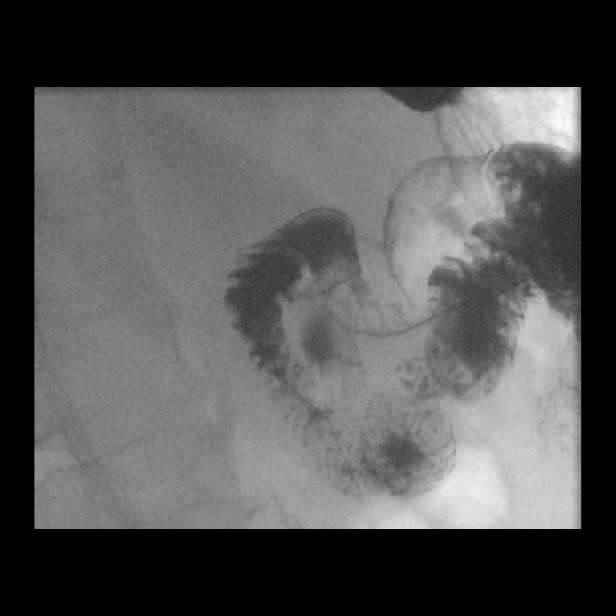

[Series 14: cp_standard · 0.55mm/px · 2 acquisitions, 3 frames shown (5 of 5)]
[im 1/2]
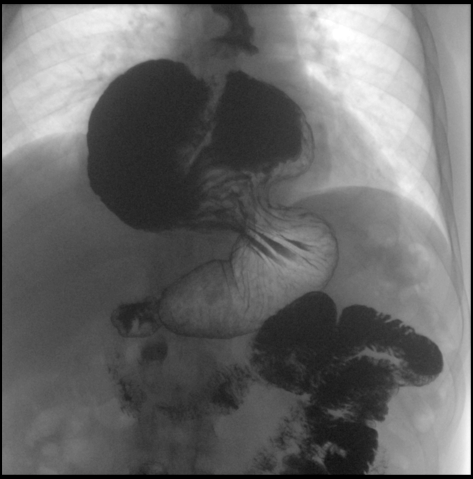
[im 1/2]
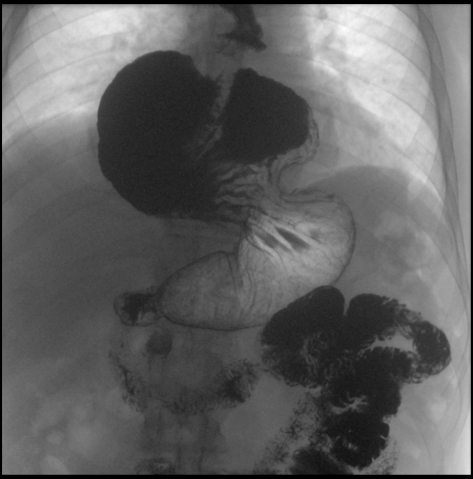
[im 2/2]
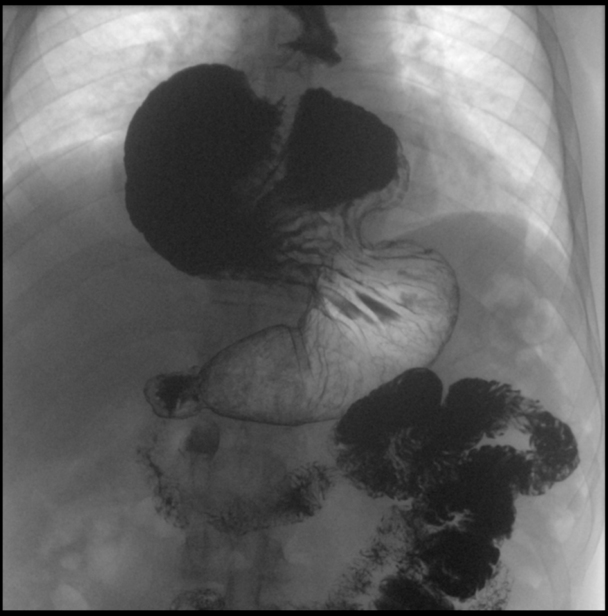

[15 of 18 positions shown; findings below may reference images not displayed]

FINDINGS: Scout view of the abdomen shows a fair amount of stool in the colon.
No small bowel dilatation. No unexpected radiopaque calculi.

Double contrast examination of the upper gastrointestinal tract
shows normal esophageal motility. No esophageal fold thickening,
stricture or obstruction. Large hiatal hernia without torsion. There
may be a small centrally filling ulceration along the greater
curvature of the stomach, above the hemidiaphragm. Stomach and
duodenal bulb are otherwise within normal limits. A 13 mm barium
tablet passed into the stomach.
IMPRESSION: 1. Large hiatal hernia without torsion.
2. Question centrally filling ulceration along the greater curvature
of the stomach, above the hemidiaphragm.

## 2019-06-12 DIAGNOSIS — Z23 Encounter for immunization: Secondary | ICD-10-CM | POA: Diagnosis not present

## 2019-07-09 DIAGNOSIS — K2981 Duodenitis with bleeding: Secondary | ICD-10-CM | POA: Diagnosis not present

## 2019-07-09 DIAGNOSIS — D500 Iron deficiency anemia secondary to blood loss (chronic): Secondary | ICD-10-CM | POA: Diagnosis not present

## 2019-07-09 DIAGNOSIS — K449 Diaphragmatic hernia without obstruction or gangrene: Secondary | ICD-10-CM | POA: Diagnosis not present

## 2019-07-09 DIAGNOSIS — E7800 Pure hypercholesterolemia, unspecified: Secondary | ICD-10-CM | POA: Diagnosis not present

## 2019-07-09 DIAGNOSIS — I10 Essential (primary) hypertension: Secondary | ICD-10-CM | POA: Diagnosis not present

## 2019-11-13 DIAGNOSIS — L570 Actinic keratosis: Secondary | ICD-10-CM | POA: Diagnosis not present

## 2020-01-07 DIAGNOSIS — D500 Iron deficiency anemia secondary to blood loss (chronic): Secondary | ICD-10-CM | POA: Diagnosis not present

## 2020-01-07 DIAGNOSIS — K219 Gastro-esophageal reflux disease without esophagitis: Secondary | ICD-10-CM | POA: Diagnosis not present

## 2020-01-07 DIAGNOSIS — I10 Essential (primary) hypertension: Secondary | ICD-10-CM | POA: Diagnosis not present

## 2020-01-07 DIAGNOSIS — K2981 Duodenitis with bleeding: Secondary | ICD-10-CM | POA: Diagnosis not present

## 2020-01-07 DIAGNOSIS — E7800 Pure hypercholesterolemia, unspecified: Secondary | ICD-10-CM | POA: Diagnosis not present

## 2020-05-05 DIAGNOSIS — Z961 Presence of intraocular lens: Secondary | ICD-10-CM | POA: Diagnosis not present

## 2020-05-05 DIAGNOSIS — Z139 Encounter for screening, unspecified: Secondary | ICD-10-CM | POA: Diagnosis not present

## 2020-05-05 DIAGNOSIS — Z9181 History of falling: Secondary | ICD-10-CM | POA: Diagnosis not present

## 2020-05-05 DIAGNOSIS — Z0000 Encounter for general adult medical examination without abnormal findings: Secondary | ICD-10-CM | POA: Diagnosis not present

## 2020-05-05 DIAGNOSIS — H524 Presbyopia: Secondary | ICD-10-CM | POA: Diagnosis not present

## 2020-05-05 DIAGNOSIS — E785 Hyperlipidemia, unspecified: Secondary | ICD-10-CM | POA: Diagnosis not present

## 2020-06-17 DIAGNOSIS — L821 Other seborrheic keratosis: Secondary | ICD-10-CM | POA: Diagnosis not present

## 2020-06-17 DIAGNOSIS — L570 Actinic keratosis: Secondary | ICD-10-CM | POA: Diagnosis not present

## 2020-06-17 DIAGNOSIS — L814 Other melanin hyperpigmentation: Secondary | ICD-10-CM | POA: Diagnosis not present

## 2020-07-09 DIAGNOSIS — K449 Diaphragmatic hernia without obstruction or gangrene: Secondary | ICD-10-CM | POA: Diagnosis not present

## 2020-07-09 DIAGNOSIS — I10 Essential (primary) hypertension: Secondary | ICD-10-CM | POA: Diagnosis not present

## 2020-07-09 DIAGNOSIS — D500 Iron deficiency anemia secondary to blood loss (chronic): Secondary | ICD-10-CM | POA: Diagnosis not present

## 2020-07-09 DIAGNOSIS — E7800 Pure hypercholesterolemia, unspecified: Secondary | ICD-10-CM | POA: Diagnosis not present

## 2020-07-09 DIAGNOSIS — K2981 Duodenitis with bleeding: Secondary | ICD-10-CM | POA: Diagnosis not present

## 2021-01-07 DIAGNOSIS — K449 Diaphragmatic hernia without obstruction or gangrene: Secondary | ICD-10-CM | POA: Diagnosis not present

## 2021-01-07 DIAGNOSIS — E7800 Pure hypercholesterolemia, unspecified: Secondary | ICD-10-CM | POA: Diagnosis not present

## 2021-01-07 DIAGNOSIS — D500 Iron deficiency anemia secondary to blood loss (chronic): Secondary | ICD-10-CM | POA: Diagnosis not present

## 2021-01-07 DIAGNOSIS — K2981 Duodenitis with bleeding: Secondary | ICD-10-CM | POA: Diagnosis not present

## 2021-01-07 DIAGNOSIS — I10 Essential (primary) hypertension: Secondary | ICD-10-CM | POA: Diagnosis not present

## 2021-03-17 DIAGNOSIS — L570 Actinic keratosis: Secondary | ICD-10-CM | POA: Diagnosis not present

## 2021-03-17 DIAGNOSIS — L821 Other seborrheic keratosis: Secondary | ICD-10-CM | POA: Diagnosis not present

## 2021-05-05 DIAGNOSIS — H524 Presbyopia: Secondary | ICD-10-CM | POA: Diagnosis not present

## 2021-05-05 DIAGNOSIS — Z961 Presence of intraocular lens: Secondary | ICD-10-CM | POA: Diagnosis not present

## 2021-05-07 DIAGNOSIS — Z139 Encounter for screening, unspecified: Secondary | ICD-10-CM | POA: Diagnosis not present

## 2021-05-07 DIAGNOSIS — Z0000 Encounter for general adult medical examination without abnormal findings: Secondary | ICD-10-CM | POA: Diagnosis not present

## 2021-05-07 DIAGNOSIS — E785 Hyperlipidemia, unspecified: Secondary | ICD-10-CM | POA: Diagnosis not present

## 2021-05-07 DIAGNOSIS — Z9181 History of falling: Secondary | ICD-10-CM | POA: Diagnosis not present

## 2021-07-13 DIAGNOSIS — E7800 Pure hypercholesterolemia, unspecified: Secondary | ICD-10-CM | POA: Diagnosis not present

## 2021-07-13 DIAGNOSIS — K449 Diaphragmatic hernia without obstruction or gangrene: Secondary | ICD-10-CM | POA: Diagnosis not present

## 2021-07-13 DIAGNOSIS — J309 Allergic rhinitis, unspecified: Secondary | ICD-10-CM | POA: Diagnosis not present

## 2021-07-13 DIAGNOSIS — R7303 Prediabetes: Secondary | ICD-10-CM | POA: Diagnosis not present

## 2021-07-13 DIAGNOSIS — K2981 Duodenitis with bleeding: Secondary | ICD-10-CM | POA: Diagnosis not present

## 2021-07-13 DIAGNOSIS — I10 Essential (primary) hypertension: Secondary | ICD-10-CM | POA: Diagnosis not present

## 2022-01-11 DIAGNOSIS — J309 Allergic rhinitis, unspecified: Secondary | ICD-10-CM | POA: Diagnosis not present

## 2022-01-11 DIAGNOSIS — I10 Essential (primary) hypertension: Secondary | ICD-10-CM | POA: Diagnosis not present

## 2022-01-11 DIAGNOSIS — R7303 Prediabetes: Secondary | ICD-10-CM | POA: Diagnosis not present

## 2022-01-11 DIAGNOSIS — E7800 Pure hypercholesterolemia, unspecified: Secondary | ICD-10-CM | POA: Diagnosis not present

## 2022-01-11 DIAGNOSIS — K449 Diaphragmatic hernia without obstruction or gangrene: Secondary | ICD-10-CM | POA: Diagnosis not present

## 2022-01-11 DIAGNOSIS — K2981 Duodenitis with bleeding: Secondary | ICD-10-CM | POA: Diagnosis not present

## 2022-01-19 DIAGNOSIS — L578 Other skin changes due to chronic exposure to nonionizing radiation: Secondary | ICD-10-CM | POA: Diagnosis not present

## 2022-01-19 DIAGNOSIS — C4442 Squamous cell carcinoma of skin of scalp and neck: Secondary | ICD-10-CM | POA: Diagnosis not present

## 2022-01-19 DIAGNOSIS — L570 Actinic keratosis: Secondary | ICD-10-CM | POA: Diagnosis not present

## 2022-01-19 DIAGNOSIS — L814 Other melanin hyperpigmentation: Secondary | ICD-10-CM | POA: Diagnosis not present

## 2022-01-19 DIAGNOSIS — L821 Other seborrheic keratosis: Secondary | ICD-10-CM | POA: Diagnosis not present

## 2022-01-21 DIAGNOSIS — C490 Malignant neoplasm of connective and soft tissue of head, face and neck: Secondary | ICD-10-CM | POA: Diagnosis not present

## 2022-02-02 DIAGNOSIS — C490 Malignant neoplasm of connective and soft tissue of head, face and neck: Secondary | ICD-10-CM | POA: Diagnosis not present

## 2022-05-13 DIAGNOSIS — E785 Hyperlipidemia, unspecified: Secondary | ICD-10-CM | POA: Diagnosis not present

## 2022-05-13 DIAGNOSIS — Z9181 History of falling: Secondary | ICD-10-CM | POA: Diagnosis not present

## 2022-05-13 DIAGNOSIS — Z139 Encounter for screening, unspecified: Secondary | ICD-10-CM | POA: Diagnosis not present

## 2022-05-13 DIAGNOSIS — Z0000 Encounter for general adult medical examination without abnormal findings: Secondary | ICD-10-CM | POA: Diagnosis not present

## 2022-05-27 DIAGNOSIS — Z961 Presence of intraocular lens: Secondary | ICD-10-CM | POA: Diagnosis not present

## 2022-05-27 DIAGNOSIS — H524 Presbyopia: Secondary | ICD-10-CM | POA: Diagnosis not present

## 2022-05-27 DIAGNOSIS — H40003 Preglaucoma, unspecified, bilateral: Secondary | ICD-10-CM | POA: Diagnosis not present

## 2022-07-22 DIAGNOSIS — L570 Actinic keratosis: Secondary | ICD-10-CM | POA: Diagnosis not present

## 2022-07-22 DIAGNOSIS — C490 Malignant neoplasm of connective and soft tissue of head, face and neck: Secondary | ICD-10-CM | POA: Diagnosis not present

## 2022-08-11 ENCOUNTER — Ambulatory Visit (INDEPENDENT_AMBULATORY_CARE_PROVIDER_SITE_OTHER): Payer: Medicare Other | Admitting: Family

## 2022-08-11 ENCOUNTER — Encounter: Payer: Self-pay | Admitting: Family

## 2022-08-11 VITALS — BP 139/84 | HR 78 | Temp 98.0°F | Resp 16 | Ht 69.0 in | Wt 191.4 lb

## 2022-08-11 DIAGNOSIS — D649 Anemia, unspecified: Secondary | ICD-10-CM | POA: Diagnosis not present

## 2022-08-11 DIAGNOSIS — K449 Diaphragmatic hernia without obstruction or gangrene: Secondary | ICD-10-CM

## 2022-08-11 DIAGNOSIS — C801 Malignant (primary) neoplasm, unspecified: Secondary | ICD-10-CM | POA: Diagnosis not present

## 2022-08-11 DIAGNOSIS — E7800 Pure hypercholesterolemia, unspecified: Secondary | ICD-10-CM

## 2022-08-11 DIAGNOSIS — I10 Essential (primary) hypertension: Secondary | ICD-10-CM

## 2022-08-11 DIAGNOSIS — K219 Gastro-esophageal reflux disease without esophagitis: Secondary | ICD-10-CM | POA: Diagnosis not present

## 2022-08-11 DIAGNOSIS — Z77098 Contact with and (suspected) exposure to other hazardous, chiefly nonmedicinal, chemicals: Secondary | ICD-10-CM | POA: Diagnosis not present

## 2022-08-11 DIAGNOSIS — Z5320 Procedure and treatment not carried out because of patient's decision for unspecified reasons: Secondary | ICD-10-CM | POA: Diagnosis not present

## 2022-08-11 DIAGNOSIS — C4490 Unspecified malignant neoplasm of skin, unspecified: Secondary | ICD-10-CM | POA: Insufficient documentation

## 2022-08-11 DIAGNOSIS — Z125 Encounter for screening for malignant neoplasm of prostate: Secondary | ICD-10-CM

## 2022-08-11 LAB — LIPID PANEL
Cholesterol: 190 mg/dL (ref 0–200)
HDL: 49.8 mg/dL (ref >39.00)
LDL Cholesterol: 121 mg/dL — ABNORMAL HIGH (ref 0–99)
NonHDL: 140.11
Total CHOL/HDL Ratio: 4
Triglycerides: 94 mg/dL (ref 0.0–149.0)
VLDL: 18.8 mg/dL (ref 0.0–40.0)

## 2022-08-11 LAB — CBC WITH DIFFERENTIAL/PLATELET
Basophils Absolute: 0 10*3/uL (ref 0.0–0.1)
Basophils Relative: 0.5 % (ref 0.0–3.0)
Eosinophils Absolute: 0.1 10*3/uL (ref 0.0–0.7)
Eosinophils Relative: 1 % (ref 0.0–5.0)
HCT: 45.8 % (ref 39.0–52.0)
Hemoglobin: 15.5 g/dL (ref 13.0–17.0)
Lymphocytes Relative: 19.7 % (ref 12.0–46.0)
Lymphs Abs: 2 10*3/uL (ref 0.7–4.0)
MCHC: 33.8 g/dL (ref 30.0–36.0)
MCV: 99.1 fl (ref 78.0–100.0)
Monocytes Absolute: 0.7 10*3/uL (ref 0.1–1.0)
Monocytes Relative: 6.7 % (ref 3.0–12.0)
Neutro Abs: 7.4 10*3/uL (ref 1.4–7.7)
Neutrophils Relative %: 72.1 % (ref 43.0–77.0)
Platelets: 240 10*3/uL (ref 150.0–400.0)
RBC: 4.62 Mil/uL (ref 4.22–5.81)
RDW: 13.3 % (ref 11.5–15.5)
WBC: 10.3 10*3/uL (ref 4.0–10.5)

## 2022-08-11 LAB — COMPREHENSIVE METABOLIC PANEL
ALT: 40 U/L (ref 0–53)
AST: 26 U/L (ref 0–37)
Albumin: 4.8 g/dL (ref 3.5–5.2)
Alkaline Phosphatase: 51 U/L (ref 39–117)
BUN: 12 mg/dL (ref 6–23)
CO2: 28 mEq/L (ref 19–32)
Calcium: 9.7 mg/dL (ref 8.4–10.5)
Chloride: 104 mEq/L (ref 96–112)
Creatinine, Ser: 0.84 mg/dL (ref 0.40–1.50)
GFR: 86.12 mL/min (ref >60.00)
Glucose, Bld: 98 mg/dL (ref 70–99)
Potassium: 4.3 mEq/L (ref 3.5–5.1)
Sodium: 141 mEq/L (ref 135–145)
Total Bilirubin: 0.7 mg/dL (ref 0.2–1.2)
Total Protein: 7.3 g/dL (ref 6.0–8.3)

## 2022-08-11 LAB — FERRITIN: Ferritin: 82.6 ng/mL (ref 22.0–322.0)

## 2022-08-11 LAB — PSA: PSA: 3.02 ng/mL (ref 0.10–4.00)

## 2022-08-11 NOTE — Assessment & Plan Note (Signed)
Discussed importance of early detection. Offered colonoscopy and cologuard. Pt declines both.

## 2022-08-11 NOTE — Assessment & Plan Note (Signed)
Has seen GI in the past.  Clinically stable.

## 2022-08-11 NOTE — Assessment & Plan Note (Signed)
Maintained on pravastatin.  Continue same. Obtain follow up lipid panel.

## 2022-08-11 NOTE — Assessment & Plan Note (Signed)
Continues pantoprazole and pepcid for reflux.

## 2022-08-11 NOTE — Assessment & Plan Note (Signed)
Lab Results  Component Value Date   WBC 7.1 07/19/2017   HGB 13.3 07/19/2017   HCT 41.4 07/19/2017   MCV 92.5 07/19/2017   PLT 281.0 07/19/2017   Was on iron in the past.  This occurred in setting of duodenal ulcers.

## 2022-08-11 NOTE — Assessment & Plan Note (Signed)
BP Readings from Last 3 Encounters:  08/11/22 139/84  07/19/17 (!) 146/84  06/29/17 131/78   BP at goal on losartan and amlodipine. Continue same.

## 2022-08-11 NOTE — Progress Notes (Signed)
Subjective:   By signing my name below, I, Shehryar Baig, attest that this documentation has been prepared under the direction and in the presence of Debbrah Alar, NP. 08/11/2022   Patient ID: Leroy Horton, male    DOB: 1945/10/16, 76 y.o.   MRN: [id number]  Chief Complaint  Patient presents with   Establish Care    Flu vaccine already completed[facility]     HPI Patient is in today for a new patient visit.   [facility]: He continues following up with the [place] clinic. He had a primary care provider at [facility].  Blood pressure: He has a history of high blood pressure. He was diagnosed by the [facility] and reports cause was from agent orange. He is currently taking 10 mg amlodipine daily PO, 100 mg losartan daily PO and reports no new issues while taking them.  BP Readings from Last 3 Encounters:  08/11/22 139/84  07/19/17 (!) 146/84  06/29/17 131/78   Pulse Readings from Last 3 Encounters:  08/11/22 78  07/19/17 86  06/29/17 78   Cholesterol: He is taking 20 mg Pravachol to manage his cholesterol and reports no new issues while taking it. He is also taking vitamin B12 supplements to potential counteract side effects from the Pravachol.   Skin Cancer: He has a history of basal cell carcinoma on her head and multiple spots on his face and one on his left arm. Her recently had one removed from his scalp.   Hiatal hernia: He has a history of hiatal hernia. He is following up with his GI specialist. He has no followed up with them in a while and reports no new issues since then.   Reflux: He is taking 40 mg Protonix daily PO and 40 mg Pepcid every night to manage his reflux. He is also sleeping upright to help manage his reflux.   Anemia: He had a history of anemia with his hernia and was taking iron supplements.   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 He gardens and does yard work in his free time.  He has 2 healthy sons age 49 and 6 years old. He has one granddaughter. He has no pets. He has no living parents. His parents both have a history of high blood pressure. They had procedures to fix their ulcers. He has 2 sister and one brother. He has one sister who is deceased and passed away from a car accident. His living sister has a history of hiatal hernia and recently had a procedure. His brother has a history of aneurysms. He does not know his maternal grandparents history. He does not know his paternal grandparents history.  He does not use tobacco or vaping products. He has a history of smoking and stopped 40 years ago so that he can run.   Surgical history: He has a surgical history of  a rotator cuff repair, hand procedure, cataracts procedure, and appendix removal procedure.   Exercise: He participates in regular exercise by walking, yard work, and gardening.   Alcohol: He drinks 3-4 times a week and drinks around 3-4 drinks each time.   PSA: He is due for a PSA screening. He has never measured an elevated PSA level.   Immunizations: He is UTD on flu vaccine this year. He is interested in receiving the latest Covid-19 booster vaccine at his pharmacy.   Colonoscopy: He has never completed  a colonoscopy and is not interested in scheduling an appointment a this time. He is interested in following up with his GI specialist. He has tried cologuard in the past but could not complete it and is not interested in trying again.    Health Maintenance Due  Topic Date Due   Hepatitis C Screening  Never done   COVID-19 Vaccine (6 - 2023-24 season) 05/07/2022   Medicare Annual Wellness (AWV)  05/07/2022    Past Medical History:  Diagnosis Date   Acid reflux    Allergy    Anemia    Arthritis    Cancer ([place])    skin (basal cell multiple) followed by [facility].   Cataract    Duodenal ulcer    H/O agent Orange exposure    Hiatal hernia - 10 cm w/ Lysbeth Galas erosions 06/28/2017    High cholesterol    Hypertension    Multiple duodenal ulcers     Past Surgical History:  Procedure Laterality Date   APPENDECTOMY     CAPSULOTOMY     CATARACT EXTRACTION Bilateral 2013   ESOPHAGOGASTRODUODENOSCOPY N/A 06/28/2017   Procedure: ESOPHAGOGASTRODUODENOSCOPY (EGD);  Surgeon: Gatha Mayer, MD;  Location: [facility] ENDOSCOPY;  Service: Endoscopy;  Laterality: N/A;   HAND SURGERY Right 2015   duputrens contracture   ROTATOR CUFF REPAIR Left 2005    Family History  Problem Relation Age of Onset   Hypertension Mother    Ulcers Mother    Hypertension Father    Ulcers Father    Hiatal hernia Sister    Aneurysm Brother        brain   Cancer Neg Hx     Social History   Socioeconomic History   Marital status: Married    Spouse name: Mardene Celeste   Number of children: Not on file   Years of education: Not on file   Highest education level: Not on file  Occupational History   Occupation: retired    Comment: Arts administrator  Tobacco Use   Smoking status: Former    Types: Cigarettes, Cigars    Quit date: 1980    Years since quitting: 43.9   Smokeless tobacco: Never  Substance and Sexual Activity   Alcohol use: Yes    Comment: 3-4 x a week will have 3-4 drinks   Drug use: Not Currently    Types: Marijuana   Sexual activity: Yes    Partners: Female    Birth control/protection: None  Other Topics Concern   Not on file  Social History Narrative   Retired from Arts administrator (Production assistant, radio)   Wife, Hospital doctor, teaches at [employer] of [place] ([place] vet)   Completed trade school (2 years)   Enjoys Scientist, water quality, yard work   2 sons (both Radiation protection practitioner)   1 grand daughter   No pets    Social Determinants of Radio broadcast assistant Strain: Not on Art therapist Insecurity: Not on file  Transportation Needs: Not on file  Physical Activity: Not on file  Stress: Not on file  Social Connections: Not on file  Intimate Partner Violence: Not on file     Outpatient Medications Prior to Visit  Medication Sig Dispense Refill   amLODipine (NORVASC) 10 MG tablet Take 10 mg by mouth daily.  1   CALCIUM MAGNESIUM ZINC PO Take by mouth.     Cholecalciferol (VITAMIN D3) 50 MCG (2000 UT) capsule Take 2,000 Units by mouth daily.     Coenzyme  Q10 (COQ-10) 200 MG CAPS Take 200 mcg by mouth daily.     famotidine (PEPCID) 40 MG tablet Take 40 mg by mouth daily.     fluticasone (FLONASE) 50 MCG/ACT nasal spray Place 2 sprays into both nostrils daily as needed for allergies.   0   losartan (COZAAR) 100 MG tablet Take 100 mg by mouth daily.  1   Multiple Vitamin (MULTIVITAMIN WITH MINERALS) TABS tablet Take 1 tablet by mouth daily.     pantoprazole (PROTONIX) 40 MG tablet Take 1 tablet (40 mg total) by mouth daily before breakfast. 90 tablet 3   PRAVACHOL 20 MG tablet Take 20 mg by mouth daily.     vitamin B-12 (CYANOCOBALAMIN) 500 MCG tablet Take 500 mcg by mouth daily.     fluticasone (FLONASE) 50 MCG/ACT nasal spray Place 2 sprays into both nostrils daily.     ferrous sulfate 325 (65 FE) MG tablet Take 1 tablet (325 mg total) 2 (two) times daily with a meal by mouth. (Patient not taking: Reported on 08/11/2022) 180 tablet 3   pravastatin (PRAVACHOL) 40 MG tablet Take 1 tablet (40 mg total) by mouth at bedtime. 90 tablet 3   ranitidine (ZANTAC) 300 MG tablet Take 300 mg by mouth at bedtime.  (Patient not taking: Reported on 08/11/2022)  3   No facility-administered medications prior to visit.    Allergies  Allergen Reactions   Lisinopril Cough   Sulfa Antibiotics    Sulfamethoxazole Rash    ROS See HPI    Objective:    Physical Exam Constitutional:      General: He is not in acute distress.    Appearance: Normal appearance. He is not ill-appearing.  HENT:     Head: Normocephalic and atraumatic.     Right Ear: External ear normal.     Left Ear: External ear normal.  Eyes:     Extraocular Movements: Extraocular movements intact.      Pupils: Pupils are equal, round, and reactive to light.  Cardiovascular:     Rate and Rhythm: Normal rate and regular rhythm.     Heart sounds: Normal heart sounds. No murmur heard.    No gallop.  Pulmonary:     Effort: Pulmonary effort is normal. No respiratory distress.     Breath sounds: Normal breath sounds. No wheezing or rales.  Skin:    General: Skin is warm and dry.  Neurological:     Mental Status: He is alert and oriented to person, place, and time.  Psychiatric:        Judgment: Judgment normal.     BP 139/84   Pulse 78   Temp 98 F (36.7 C) (Oral)   Resp 16   Ht '5\' 9"'$  (1.753 m)   Wt 191 lb 6 oz (86.8 kg)   SpO2 96%   BMI 28.26 kg/m  Wt Readings from Last 3 Encounters:  08/11/22 191 lb 6 oz (86.8 kg)  07/19/17 187 lb (84.8 kg)  06/27/17 184 lb 4.9 oz (83.6 kg)   Assessment & Plan:  High cholesterol Assessment & Plan: Maintained on pravastatin.  Continue same. Obtain follow up lipid panel.   Orders: -     Lipid panel  H/O agent Orange exposure  history of basal cell carcinoma Assessment & Plan: Followed by Dermatology in [place].    Primary hypertension Assessment & Plan: BP Readings from Last 3 Encounters:  08/11/22 139/84  07/19/17 (!) 146/84  06/29/17 131/78   BP at goal  on losartan and amlodipine. Continue same.   Orders: -     Comprehensive metabolic panel  Hiatal hernia - 10 cm w/ Lysbeth Galas erosions Assessment & Plan: Has seen GI in the past.  Clinically stable.    Gastroesophageal reflux disease, unspecified whether esophagitis present Assessment & Plan: Continues pantoprazole and pepcid for reflux.    Symptomatic anemia Assessment & Plan: Lab Results  Component Value Date   WBC 7.1 07/19/2017   HGB 13.3 07/19/2017   HCT 41.4 07/19/2017   MCV 92.5 07/19/2017   PLT 281.0 07/19/2017   Was on iron in the past.  This occurred in setting of duodenal ulcers.   Orders: -     CBC with Differential/Platelet -      Ferritin  Prostate cancer screening -     PSA  Colon cancer screening declined Assessment & Plan: Discussed importance of early detection. Offered colonoscopy and cologuard. Pt declines both.      No orders of the defined types were placed in this encounter.   I, Nance Pear, NP, personally preformed the services described in this documentation.  All medical record entries made by the scribe were at my direction and in my presence.  I have reviewed the chart and discharge instructions (if applicable) and agree that the record reflects my personal performance and is accurate and complete. 08/11/2022   I,Shehryar Baig,acting as a Education administrator for Nance Pear, NP.,have documented all relevant documentation on the behalf of Nance Pear, NP,as directed by  Nance Pear, NP while in the presence of Nance Pear, NP.   Nance Pear, NP

## 2022-08-11 NOTE — Assessment & Plan Note (Signed)
Followed by Dermatology in [place].

## 2022-09-03 ENCOUNTER — Encounter: Payer: Self-pay | Admitting: Family

## 2022-09-03 MED ORDER — PANTOPRAZOLE SODIUM 40 MG PO TBEC: 40.0000 mg | DELAYED_RELEASE_TABLET | Freq: Every day | ORAL | 3 refills | Status: DC

## 2022-09-03 MED ORDER — PRAVACHOL 20 MG PO TABS: 20.0000 mg | ORAL_TABLET | Freq: Every day | ORAL | 0 refills | Status: DC

## 2022-09-07 ENCOUNTER — Encounter: Payer: Self-pay | Admitting: Family

## 2022-09-09 MED ORDER — PRAVASTATIN SODIUM 10 MG PO TABS: 20.0000 mg | ORAL_TABLET | Freq: Every day | ORAL | 1 refills | Status: DC

## 2022-09-14 ENCOUNTER — Telehealth: Payer: Self-pay

## 2022-09-14 MED ORDER — PRAVASTATIN SODIUM 20 MG PO TABS: 20.0000 mg | ORAL_TABLET | Freq: Every day | ORAL | 1 refills | Status: DC

## 2022-09-14 NOTE — Telephone Encounter (Signed)
Pt's insurance does not cover pravastatin '10mg'$  2 tablets daily. Okay to increase to '20mg'$  tablet?

## 2022-11-16 ENCOUNTER — Telehealth: Payer: Self-pay | Admitting: Family

## 2022-11-16 NOTE — Telephone Encounter (Signed)
Contacted Leroy Horton to schedule their annual wellness visit. Appointment made for 11/23/2022.  Sherol Dade; Care Guide Ambulatory Clinical Support [facility] Direct Dial: [phone]

## 2022-11-18 ENCOUNTER — Other Ambulatory Visit: Payer: Self-pay | Admitting: Family

## 2022-11-19 NOTE — Telephone Encounter (Signed)
error 

## 2022-11-23 ENCOUNTER — Ambulatory Visit: Payer: Medicare Other

## 2022-11-25 DIAGNOSIS — H40013 Open angle with borderline findings, low risk, bilateral: Secondary | ICD-10-CM | POA: Diagnosis not present

## 2022-12-02 ENCOUNTER — Other Ambulatory Visit: Payer: Self-pay | Admitting: Family

## 2022-12-02 ENCOUNTER — Encounter: Payer: Self-pay | Admitting: Family

## 2022-12-02 MED ORDER — FAMOTIDINE 40 MG PO TABS: 40.0000 mg | ORAL_TABLET | Freq: Every day | ORAL | 1 refills | Status: DC

## 2022-12-07 ENCOUNTER — Encounter: Payer: Self-pay | Admitting: Family

## 2022-12-07 MED ORDER — AMLODIPINE BESYLATE 10 MG PO TABS: 10.0000 mg | ORAL_TABLET | Freq: Every day | ORAL | 1 refills | Status: DC

## 2022-12-07 MED ORDER — FLUTICASONE PROPIONATE 50 MCG/ACT NA SUSP: 2.0000 | Freq: Every day | NASAL | 1 refills | Status: DC | PRN

## 2023-01-20 DIAGNOSIS — L570 Actinic keratosis: Secondary | ICD-10-CM | POA: Diagnosis not present

## 2023-01-20 DIAGNOSIS — L814 Other melanin hyperpigmentation: Secondary | ICD-10-CM | POA: Diagnosis not present

## 2023-01-20 DIAGNOSIS — L578 Other skin changes due to chronic exposure to nonionizing radiation: Secondary | ICD-10-CM | POA: Diagnosis not present

## 2023-01-20 DIAGNOSIS — D225 Melanocytic nevi of trunk: Secondary | ICD-10-CM | POA: Diagnosis not present

## 2023-01-20 DIAGNOSIS — L821 Other seborrheic keratosis: Secondary | ICD-10-CM | POA: Diagnosis not present

## 2023-02-11 ENCOUNTER — Encounter: Payer: Self-pay | Admitting: Family

## 2023-02-11 ENCOUNTER — Ambulatory Visit (INDEPENDENT_AMBULATORY_CARE_PROVIDER_SITE_OTHER): Payer: Medicare Other | Admitting: Family

## 2023-02-11 VITALS — BP 130/65 | HR 82 | Temp 98.0°F | Resp 18 | Ht 69.0 in | Wt 193.2 lb

## 2023-02-11 DIAGNOSIS — K219 Gastro-esophageal reflux disease without esophagitis: Secondary | ICD-10-CM

## 2023-02-11 DIAGNOSIS — R251 Tremor, unspecified: Secondary | ICD-10-CM

## 2023-02-11 DIAGNOSIS — E7800 Pure hypercholesterolemia, unspecified: Secondary | ICD-10-CM

## 2023-02-11 DIAGNOSIS — Z862 Personal history of diseases of the blood and blood-forming organs and certain disorders involving the immune mechanism: Secondary | ICD-10-CM

## 2023-02-11 DIAGNOSIS — I10 Essential (primary) hypertension: Secondary | ICD-10-CM

## 2023-02-11 LAB — BASIC METABOLIC PANEL
BUN: 10 mg/dL (ref 6–23)
CO2: 25 mEq/L (ref 19–32)
Calcium: 9.6 mg/dL (ref 8.4–10.5)
Chloride: 105 mEq/L (ref 96–112)
Creatinine, Ser: 0.84 mg/dL (ref 0.40–1.50)
GFR: 85.81 mL/min (ref >60.00)
Glucose, Bld: 109 mg/dL — ABNORMAL HIGH (ref 70–99)
Potassium: 4.1 mEq/L (ref 3.5–5.1)
Sodium: 140 mEq/L (ref 135–145)

## 2023-02-11 MED ORDER — PANTOPRAZOLE SODIUM 40 MG PO TBEC: 40.0000 mg | DELAYED_RELEASE_TABLET | Freq: Every day | ORAL | 1 refills | Status: DC

## 2023-02-11 MED ORDER — FAMOTIDINE 40 MG PO TABS: 40.0000 mg | ORAL_TABLET | Freq: Every day | ORAL | 1 refills | Status: DC

## 2023-02-11 MED ORDER — AMLODIPINE BESYLATE 10 MG PO TABS: 10.0000 mg | ORAL_TABLET | Freq: Every day | ORAL | 1 refills | Status: DC

## 2023-02-11 MED ORDER — LOSARTAN POTASSIUM 100 MG PO TABS: 100.0000 mg | ORAL_TABLET | Freq: Every day | ORAL | 1 refills | Status: DC

## 2023-02-11 NOTE — Assessment & Plan Note (Addendum)
Lab Results  Component Value Date   WBC 10.3 08/11/2022   HGB 15.5 08/11/2022   HCT 45.8 08/11/2022   MCV 99.1 08/11/2022   PLT 240.0 08/11/2022   Previous hx of ulcer, no black or bloody stools.  Last cbc WNL.

## 2023-02-11 NOTE — Assessment & Plan Note (Signed)
Chronic, states that he saw Neuro at the [facility] and was told that he did not have Parkinsons. States grandfather had a head tremor. Likely Benign Familial tremor.

## 2023-02-11 NOTE — Assessment & Plan Note (Signed)
BP Readings from Last 3 Encounters:  02/11/23 (!) 150/72  08/11/22 139/84  07/19/17 (!) 146/84   Maintained on amlodipine 10mg  and losartan 100mg .

## 2023-02-11 NOTE — Assessment & Plan Note (Signed)
Stable on pantoprazole. Continue same.  

## 2023-02-11 NOTE — Assessment & Plan Note (Signed)
Lab Results  Component Value Date   CHOL 190 08/11/2022   HDL 49.80 08/11/2022   LDLCALC 121 (H) 08/11/2022   TRIG 94.0 08/11/2022   CHOLHDL 4 08/11/2022   Stable on pravastatin.

## 2023-02-11 NOTE — Progress Notes (Signed)
Subjective:   By signing my name below, I, Leroy Horton, attest that this documentation has been prepared under the direction and in the presence of Lemont Fillers, NP 02/11/23   Patient ID: Leroy Horton, male    DOB: 1945-10-31, 77 y.o.   MRN: [id number]  Chief Complaint  Patient presents with   Follow-up    HPI Patient is in today for a 6 month follow up.   Tremors: He continues to have hand and leg tremors with no changes over several years.  His legs occasionally tremor while driving.  He has followed up with neurology at the [facility] and reports it was determined he does not have Parkinson's.  He states his grandfather had a head tremor.   Hypertension: He has been compliant with 100 mg Losartan and 10 mg Amlodipine. He monitors his blood pressure at home, reports a reading of 120/70s  Hyperlipidemia: He manages his HLD  with 20 mg Pravastatin daily.   Reflux: His acid reflux is well controlled with 40 mg Pantoprazole with pepcid. He occasionally follows up with GI.  Past Medical History:  Diagnosis Date   Acid reflux    Allergy    Anemia    Arthritis    Cancer ([facility])    skin (basal cell multiple) followed by [facility].   Cataract    Duodenal ulcer    H/O agent Orange exposure    Hiatal hernia - 10 cm w/ Sheria Lang erosions 06/28/2017   High cholesterol    Hypertension    Multiple duodenal ulcers     Past Surgical History:  Procedure Laterality Date   APPENDECTOMY     CAPSULOTOMY     CATARACT EXTRACTION Bilateral 2013   ESOPHAGOGASTRODUODENOSCOPY N/A 06/28/2017   Procedure: ESOPHAGOGASTRODUODENOSCOPY (EGD);  Surgeon: Iva Boop, MD;  Location: [facility] ENDOSCOPY;  Service: Endoscopy;  Laterality: N/A;   HAND SURGERY Right 2015   duputrens contracture   ROTATOR CUFF REPAIR Left 2005    Family History  Problem Relation Age of Onset   Hypertension Mother    Ulcers Mother    Hypertension Father    Ulcers Father    Hiatal hernia Sister     Aneurysm Brother        brain   Cancer Neg Hx     Social History   Socioeconomic History   Marital status: Married    Spouse name: Elease Hashimoto   Number of children: Not on file   Years of education: Not on file   Highest education level: Associate degree: occupational, Scientist, product/process development, or vocational program  Occupational History   Occupation: retired    Comment: Estate agent  Tobacco Use   Smoking status: Former    Types: Cigarettes, Software engineer    Quit date: 1980    Years since quitting: 44.4   Smokeless tobacco: Never  Substance and Sexual Activity   Alcohol use: Yes    Comment: 3-4 x a week will have 3-4 drinks   Drug use: Not Currently    Types: Marijuana   Sexual activity: Yes    Partners: Female    Birth control/protection: None  Other Topics Concern   Not on file  Social History Narrative   Retired from Estate agent (Cabin crew)   Wife, Programmer, systems, 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)   Completed trade school (2 years)   Enjoys Diplomatic Services operational officer, yard work   2 sons (both local)   1 grand daughter   No  pets    Social Determinants of Health   Financial Resource Strain: Low Risk  (02/04/2023)   Overall Financial Resource Strain (CARDIA)    Difficulty of Paying Living Expenses: Not hard at all  Food Insecurity: No Food Insecurity (02/04/2023)   Hunger Vital Sign    Worried About Running Out of Food in the Last Year: Never true    Ran Out of Food in the Last Year: Never true  Transportation Needs: No Transportation Needs (02/04/2023)   PRAPARE - Administrator, Civil Service (Medical): No    Lack of Transportation (Non-Medical): No  Physical Activity: Sufficiently Active (02/04/2023)   Exercise Vital Sign    Days of Exercise per Week: 6 days    Minutes of Exercise per Session: 50 min  Stress: Stress Concern Present (02/04/2023)   [employer] of Occupational Health - Occupational Stress Questionnaire    Feeling of Stress :  To some extent  Social Connections: Moderately Integrated (02/04/2023)   Social Connection and Isolation Panel [NHANES]    Frequency of Communication with Friends and Family: More than three times a week    Frequency of Social Gatherings with Friends and Family: Once a week    Attends Religious Services: More than 4 times per year    Active Member of [employer] or Organizations: No    Attends Engineer, structural: Not on file    Marital Status: Married  Catering manager Violence: Not on file    Outpatient Medications Prior to Visit  Medication Sig Dispense Refill   CALCIUM MAGNESIUM ZINC PO Take by mouth.     Cholecalciferol (VITAMIN D3) 50 MCG (2000 UT) capsule Take 2,000 Units by mouth daily.     Coenzyme Q10 (COQ-10) 200 MG CAPS Take 200 mcg by mouth daily.     fluticasone (FLONASE) 50 MCG/ACT nasal spray Place 2 sprays into both nostrils daily as needed for allergies or rhinitis. 48 g 1   Multiple Vitamin (MULTIVITAMIN WITH MINERALS) TABS tablet Take 1 tablet by mouth daily.     pravastatin (PRAVACHOL) 20 MG tablet TAKE 1 TABLET BY MOUTH DAILY 100 tablet 1   vitamin B-12 (CYANOCOBALAMIN) 500 MCG tablet Take 500 mcg by mouth daily.     amLODipine (NORVASC) 10 MG tablet Take 1 tablet (10 mg total) by mouth daily. 90 tablet 1   famotidine (PEPCID) 40 MG tablet Take 1 tablet (40 mg total) by mouth daily. 90 tablet 1   losartan (COZAAR) 100 MG tablet Take 100 mg by mouth daily.  1   pantoprazole (PROTONIX) 40 MG tablet Take 1 tablet (40 mg total) by mouth daily before breakfast. 90 tablet 3   No facility-administered medications prior to visit.    Allergies  Allergen Reactions   Lisinopril Cough   Sulfa Antibiotics    Sulfamethoxazole Rash    Review of Systems  Neurological:  Positive for tremors (hands and legs).       Objective:    Physical Exam Constitutional:      General: He is not in acute distress.    Appearance: He is well-developed.  HENT:     Head:  Normocephalic and atraumatic.  Cardiovascular:     Rate and Rhythm: Normal rate and regular rhythm.     Heart sounds: No murmur heard. Pulmonary:     Effort: Pulmonary effort is normal. No respiratory distress.     Breath sounds: Normal breath sounds. No wheezing or rales.  Musculoskeletal:     Right  lower leg: 1+ Edema present.     Left lower leg: 1+ Edema present.  Skin:    General: Skin is warm and dry.  Neurological:     Mental Status: He is alert and oriented to person, place, and time.  Psychiatric:        Behavior: Behavior normal.        Thought Content: Thought content normal.     BP 130/65 (BP Location: Left Arm, Patient Position: Sitting)   Pulse 82   Temp 98 F (36.7 C)   Resp 18   Ht 5\' 9"  (1.753 m)   Wt 193 lb 3.2 oz (87.6 kg)   SpO2 97%   BMI 28.53 kg/m  Wt Readings from Last 3 Encounters:  02/11/23 193 lb 3.2 oz (87.6 kg)  08/11/22 191 lb 6 oz (86.8 kg)  07/19/17 187 lb (84.8 kg)       Assessment & Plan:  High cholesterol Assessment & Plan: Lab Results  Component Value Date   CHOL 190 08/11/2022   HDL 49.80 08/11/2022   LDLCALC 121 (H) 08/11/2022   TRIG 94.0 08/11/2022   CHOLHDL 4 08/11/2022   Stable on pravastatin.      Primary hypertension Assessment & Plan: BP Readings from Last 3 Encounters:  02/11/23 (!) 150/72  08/11/22 139/84  07/19/17 (!) 146/84   Maintained on amlodipine 10mg  and losartan 100mg .   Orders: -     Basic metabolic panel  Gastroesophageal reflux disease, unspecified whether esophagitis present Assessment & Plan: Stable on pantoprazole. Continue same.    History of anemia Assessment & Plan: Lab Results  Component Value Date   WBC 10.3 08/11/2022   HGB 15.5 08/11/2022   HCT 45.8 08/11/2022   MCV 99.1 08/11/2022   PLT 240.0 08/11/2022   Previous hx of ulcer, no black or bloody stools.  Last cbc WNL.   Tremor Assessment & Plan: Chronic, states that he saw Neuro at the [facility] and was told that he did not  have Parkinsons. States grandfather had a head tremor. Likely Benign Familial tremor.    Other orders -     Pantoprazole Sodium; Take 1 tablet (40 mg total) by mouth daily before breakfast.  Dispense: 90 tablet; Refill: 1 -     Losartan Potassium; Take 1 tablet (100 mg total) by mouth daily.  Dispense: 90 tablet; Refill: 1 -     Famotidine; Take 1 tablet (40 mg total) by mouth daily.  Dispense: 90 tablet; Refill: 1 -     amLODIPine Besylate; Take 1 tablet (10 mg total) by mouth daily.  Dispense: 90 tablet; Refill: 1     I,Rachel Rivera,acting as a scribe for Lemont Fillers, NP.,have documented all relevant documentation on the behalf of Lemont Fillers, NP,as directed by  Lemont Fillers, NP while in the presence of Lemont Fillers, NP.   I, Lemont Fillers, NP, personally preformed the services described in this documentation.  All medical record entries made by the scribe were at my direction and in my presence.  I have reviewed the chart and discharge instructions (if applicable) and agree that the record reflects my personal performance and is accurate and complete. 02/11/23   Lemont Fillers, NP

## 2023-04-19 ENCOUNTER — Encounter: Payer: Self-pay | Admitting: Family

## 2023-04-19 ENCOUNTER — Other Ambulatory Visit: Payer: Self-pay

## 2023-04-19 MED ORDER — FLUTICASONE PROPIONATE 50 MCG/ACT NA SUSP: 2.0000 | Freq: Every day | NASAL | 1 refills | Status: DC | PRN

## 2023-04-21 ENCOUNTER — Encounter (INDEPENDENT_AMBULATORY_CARE_PROVIDER_SITE_OTHER): Payer: Self-pay

## 2023-04-29 ENCOUNTER — Other Ambulatory Visit: Payer: Self-pay | Admitting: Family

## 2023-05-11 DIAGNOSIS — Z961 Presence of intraocular lens: Secondary | ICD-10-CM | POA: Diagnosis not present

## 2023-05-11 DIAGNOSIS — H40023 Open angle with borderline findings, high risk, bilateral: Secondary | ICD-10-CM | POA: Diagnosis not present

## 2023-05-12 ENCOUNTER — Other Ambulatory Visit: Payer: Self-pay | Admitting: Family

## 2023-05-18 ENCOUNTER — Ambulatory Visit (INDEPENDENT_AMBULATORY_CARE_PROVIDER_SITE_OTHER): Payer: Medicare Other | Admitting: *Deleted

## 2023-05-18 DIAGNOSIS — Z0000 Encounter for general adult medical examination without abnormal findings: Secondary | ICD-10-CM

## 2023-05-18 NOTE — Progress Notes (Signed)
Subjective:   Leroy Horton is a 77 y.o. male who presents for Medicare Annual/Subsequent preventive examination.  Visit Complete: Virtual  I connected with  Leroy Horton on 05/18/23 by a audio enabled telemedicine application and verified that I am speaking with the correct person using two identifiers.  Patient Location: Home  Provider Location: Office/Clinic  I discussed the limitations of evaluation and management by telemedicine. The patient expressed understanding and agreed to proceed.  Patient Medicare AWV questionnaire was completed by the patient on 05/11/23; I have confirmed that all information answered by patient is correct and no changes since this date.  Review of Systems     Cardiac Risk Factors include: advanced age (>71men, >55 women);male gender;dyslipidemia;hypertension     Objective:    Vital Signs: Unable to obtain new vitals due to this being a telehealth visit.      05/18/2023    9:03 AM 06/28/2017   12:02 AM 06/27/2017    5:32 PM  Advanced Directives  Does Patient Have a Medical Advance Directive? Yes No No  Type of Estate agent of [place];Living will    Does patient want to make changes to medical advance directive? No - Patient declined    Would patient like information on creating a medical advance directive?  No - Patient declined     Current Medications (verified) Outpatient Encounter Medications as of 05/18/2023  Medication Sig   amLODipine (NORVASC) 10 MG tablet Take 1 tablet (10 mg total) by mouth daily.   CALCIUM MAGNESIUM ZINC PO Take by mouth.   Cholecalciferol (VITAMIN D3) 50 MCG (2000 UT) capsule Take 2,000 Units by mouth daily.   Coenzyme Q10 (COQ-10) 200 MG CAPS Take 200 mcg by mouth daily.   famotidine (PEPCID) 40 MG tablet Take 1 tablet (40 mg total) by mouth daily.   fluticasone (FLONASE) 50 MCG/ACT nasal spray Place 2 sprays into both nostrils daily as needed for allergies or rhinitis.   losartan  (COZAAR) 100 MG tablet Take 1 tablet (100 mg total) by mouth daily.   Multiple Vitamin (MULTIVITAMIN WITH MINERALS) TABS tablet Take 1 tablet by mouth daily.   pantoprazole (PROTONIX) 40 MG tablet TAKE 1 TABLET BY MOUTH DAILY  BEFORE BREAKFAST   pravastatin (PRAVACHOL) 20 MG tablet TAKE 1 TABLET BY MOUTH DAILY   vitamin B-12 (CYANOCOBALAMIN) 500 MCG tablet Take 500 mcg by mouth daily.   No facility-administered encounter medications on file as of 05/18/2023.    Allergies (verified) Lisinopril, Sulfa antibiotics, and Sulfamethoxazole   History: Past Medical History:  Diagnosis Date   Acid reflux    Allergy    Anemia    Arthritis    Blood transfusion without reported diagnosis    Cancer (HCC)    skin (basal cell multiple) followed by [facility].   Cataract    Duodenal ulcer    Glaucoma    H/O agent Orange exposure    Heart murmur    Hiatal hernia - 10 cm w/ Sheria Lang erosions 06/28/2017   High cholesterol    Hypertension    Multiple duodenal ulcers    Past Surgical History:  Procedure Laterality Date   APPENDECTOMY     CAPSULOTOMY     CATARACT EXTRACTION Bilateral 2013   ESOPHAGOGASTRODUODENOSCOPY N/A 06/28/2017   Procedure: ESOPHAGOGASTRODUODENOSCOPY (EGD);  Surgeon: Iva Boop, MD;  Location: [facility] ENDOSCOPY;  Service: Endoscopy;  Laterality: N/A;   HAND SURGERY Right 2015   duputrens contracture   ROTATOR CUFF REPAIR Left  2005   Family History  Problem Relation Age of Onset   Hypertension Mother    Ulcers Mother    Hypertension Father    Ulcers Father    Hiatal hernia Sister    Aneurysm Brother        brain   Cancer Neg Hx    Social History   Socioeconomic History   Marital status: Married    Spouse name: Elease Hashimoto   Number of children: Not on file   Years of education: Not on file   Highest education level: Associate degree: occupational, Scientist, product/process development, or vocational program  Occupational History   Occupation: retired    Comment: Estate agent   Tobacco Use   Smoking status: Former    Current packs/day: 0.00    Types: Cigarettes, Cigars    Quit date: 1980    Years since quitting: 44.7   Smokeless tobacco: Never  Substance and Sexual Activity   Alcohol use: Yes    Comment: 3-4 x a week will have 3-4 drinks   Drug use: Not Currently    Types: Marijuana   Sexual activity: Yes    Partners: Female    Birth control/protection: None  Other Topics Concern   Not on file  Social History Narrative   Retired from Estate agent (Cabin crew)   Wife, Programmer, systems, 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)   Completed trade school (2 years)   Enjoys Diplomatic Services operational officer, yard work   2 sons (both local)   1 grand daughter   No pets    Social Determinants of Health   Financial Resource Strain: Low Risk  (05/11/2023)   Overall Financial Resource Strain (CARDIA)    Difficulty of Paying Living Expenses: Not hard at all  Food Insecurity: No Food Insecurity (05/11/2023)   Hunger Vital Sign    Worried About Running Out of Food in the Last Year: Never true    Ran Out of Food in the Last Year: Never true  Transportation Needs: No Transportation Needs (05/11/2023)   PRAPARE - Administrator, Civil Service (Medical): No    Lack of Transportation (Non-Medical): No  Physical Activity: Sufficiently Active (05/11/2023)   Exercise Vital Sign    Days of Exercise per Week: 5 days    Minutes of Exercise per Session: 50 min  Stress: No Stress Concern Present (05/11/2023)   [employer] of Occupational Health - Occupational Stress Questionnaire    Feeling of Stress : Only a little  Social Connections: Socially Integrated (05/11/2023)   Social Connection and Isolation Panel [NHANES]    Frequency of Communication with Friends and Family: More than three times a week    Frequency of Social Gatherings with Friends and Family: Once a week    Attends Religious Services: More than 4 times per year    Active Member of  [employer] or Organizations: Yes    Attends Banker Meetings: Never    Marital Status: Married    Tobacco Counseling Counseling given: Not Answered   Clinical Intake:  Pre-visit preparation completed: Yes  Pain : No/denies pain  Nutritional Risks: None Diabetes: No  How often do you need to have someone help you when you read instructions, pamphlets, or other written materials from your doctor or pharmacy?: 1 - Never  Interpreter Needed?: No  Information entered by :: Donne Anon, CMA   Activities of Daily Living    05/11/2023    2:59 PM 11/17/2022   12:40  PM  In your present state of health, do you have any difficulty performing the following activities:  Hearing? 0 0  Vision? 1 1  Comment double vision   Difficulty concentrating or making decisions? 0 0  Walking or climbing stairs? 0 0  Dressing or bathing? 0 0  Doing errands, shopping? 0   Preparing Food and eating ? N N  Using the Toilet? N N  In the past six months, have you accidently leaked urine? N N  Do you have problems with loss of bowel control? N N  Managing your Medications? N N  Managing your Finances? N N  Housekeeping or managing your Housekeeping? N N    Patient Care Team: Sandford Craze, NP as PCP - General (Internal Medicine)  Indicate any recent Medical Services you may have received from other than [facility] providers in the past year (date may be approximate).     Assessment:   This is a routine wellness examination for Name.  Hearing/Vision screen No results found.   Goals Addressed   None    Depression Screen    05/18/2023    9:05 AM 08/11/2022    8:07 AM  PHQ 2/9 Scores  PHQ - 2 Score 0 0    Fall Risk    05/11/2023    2:59 PM 11/17/2022   12:40 PM 11/17/2022    6:07 AM 08/11/2022    8:07 AM  Fall Risk   Falls in the past year? 0 0 0 0  Number falls in past yr: 0   0  Injury with Fall? 0   0  Risk for fall due to : No Fall Risks     Follow up Falls  evaluation completed   Falls evaluation completed    MEDICARE RISK AT HOME: Medicare Risk at Home Any stairs in or around the home?: No If so, are there any without handrails?: No Home free of loose throw rugs in walkways, pet beds, electrical cords, etc?: Yes Adequate lighting in your home to reduce risk of falls?: Yes Life alert?: No Use of a cane, walker or w/c?: No Grab bars in the bathroom?: No Shower chair or bench in shower?: No Elevated toilet seat or a handicapped toilet?: No  TIMED UP AND GO:  Was the test performed?  No    Cognitive Function:        05/18/2023    9:06 AM  6CIT Screen  What Year? 0 points  What month? 0 points  What time? 0 points  Count back from 20 0 points  Months in reverse 0 points  Repeat phrase 0 points  Total Score 0 points    Immunizations Immunization History  Administered Date(s) Administered   Fluad Quad(high Dose 65+) 06/02/2021   Influenza-Unspecified 06/06/2014, 06/06/2022   Moderna Covid-19 Vaccine Bivalent Booster 46yrs & up 06/17/2021   Moderna Sars-Covid-2 Vaccination 10/06/2019, 11/05/2019, 07/07/2020, 03/07/2021   Pneumococcal Conjugate-13 06/06/2014, 05/08/2015   Pneumococcal Polysaccharide-23 06/27/2015   Td (Adult),5 Lf Tetanus Toxid, Preservative Free 03/23/2022   Tdap 05/14/2011, 06/11/2012   Zoster Recombinant(Shingrix) 04/01/2020, 06/02/2020    TDAP status: Up to date  Flu Vaccine status: Due, Education has been provided regarding the importance of this vaccine. Advised may receive this vaccine at local pharmacy or Health Dept. Aware to provide a copy of the vaccination record if obtained from local pharmacy or Health Dept. Verbalized acceptance and understanding.  Pneumococcal vaccine status: Up to date  Covid-19 vaccine status: Information provided on how  to obtain vaccines.   Qualifies for Shingles Vaccine? Yes   Zostavax completed No   Shingrix Completed?: Yes  Screening Tests Health Maintenance   Topic Date Due   COVID-19 Vaccine (6 - 2023-24 season) 05/08/2023   Medicare Annual Wellness (AWV)  05/14/2023   Colonoscopy  08/12/2023 (Originally 06/12/1993)   INFLUENZA VACCINE  12/05/2023 (Originally 04/07/2023)   DTaP/Tdap/Td (4 - Td or Tdap) 03/23/2032   Pneumonia Vaccine 72+ Years old  Completed   Zoster Vaccines- Shingrix  Completed   HPV VACCINES  Aged Out   Hepatitis C Screening  Discontinued    Health Maintenance  Health Maintenance Due  Topic Date Due   COVID-19 Vaccine (6 - 2023-24 season) 05/08/2023   Medicare Annual Wellness (AWV)  05/14/2023    Colorectal Cancer screen: Pt declined   Lung Cancer Screening: (Low Dose CT Chest recommended if Age 68-80 years, 20 pack-year currently smoking OR have quit w/in 15years.) does not qualify.   Additional Screening:  Hepatitis C Screening: does qualify; Completed N/a  Vision Screening: Recommended annual ophthalmology exams for early detection of glaucoma and other disorders of the eye. Is the patient up to date with their annual eye exam?  Yes  Who is the provider or what is the name of the office in which the patient attends annual eye exams? [facility] but is transferring everything to the [place] in [place] If pt is not established with a provider, would they like to be referred to a provider to establish care? No .   Dental Screening: Recommended annual dental exams for proper oral hygiene  Diabetic Foot Exam: N/a  Community Resource Referral / Chronic Care Management: CRR required this visit?  No   CCM required this visit?  No     Plan:     I have personally reviewed and noted the following in the patient's chart:   Medical and social history Use of alcohol, tobacco or illicit drugs  Current medications and supplements including opioid prescriptions. Patient is not currently taking opioid prescriptions. Functional ability and status Nutritional status Physical activity Advanced directives List of  other physicians Hospitalizations, surgeries, and ER visits in previous 12 months Vitals Screenings to include cognitive, depression, and falls Referrals and appointments  In addition, I have reviewed and discussed with patient certain preventive protocols, quality metrics, and best practice recommendations. A written personalized care plan for preventive services as well as general preventive health recommendations were provided to patient.     Donne Anon, CMA   05/18/2023   After Visit Summary: (MyChart) Due to this being a telephonic visit, the after visit summary with patients personalized plan was offered to patient via MyChart   Nurse Notes: None

## 2023-05-18 NOTE — Patient Instructions (Signed)
Leroy Horton , Thank you for taking time to come for your Medicare Wellness Visit. I appreciate your ongoing commitment to your health goals. Please review the following plan we discussed and let me know if I can assist you in the future.   These are the goals we discussed:  Goals   None     This is a list of the screening recommended for you and due dates:  Health Maintenance  Topic Date Due   COVID-19 Vaccine (6 - 2023-24 season) 05/08/2023   Colon Cancer Screening  08/12/2023*   Flu Shot  12/05/2023*   Medicare Annual Wellness Visit  05/17/2024   DTaP/Tdap/Td vaccine (4 - Td or Tdap) 03/23/2032   Pneumonia Vaccine  Completed   Zoster (Shingles) Vaccine  Completed   HPV Vaccine  Aged Out   Hepatitis C Screening  Discontinued  *Topic was postponed. The date shown is not the original due date.     Next appointment: Follow up in one year for your annual wellness visit.   Preventive Care 32 Years and Older, Male Preventive care refers to lifestyle choices and visits with your health care provider that can promote health and wellness. What does preventive care include? A yearly physical exam. This is also called an annual well check. Dental exams once or twice a year. Routine eye exams. Ask your health care provider how often you should have your eyes checked. Personal lifestyle choices, including: Daily care of your teeth and gums. Regular physical activity. Eating a healthy diet. Avoiding tobacco and drug use. Limiting alcohol use. Practicing safe sex. Taking low doses of aspirin every day. Taking vitamin and mineral supplements as recommended by your health care provider. What happens during an annual well check? The services and screenings done by your health care provider during your annual well check will depend on your age, overall health, lifestyle risk factors, and family history of disease. Counseling  Your health care provider may ask you questions about  your: Alcohol use. Tobacco use. Drug use. Emotional well-being. Home and relationship well-being. Sexual activity. Eating habits. History of falls. Memory and ability to understand (cognition). Work and work Astronomer. Screening  You may have the following tests or measurements: Height, weight, and BMI. Blood pressure. Lipid and cholesterol levels. These may be checked every 5 years, or more frequently if you are over 61 years old. Skin check. Lung cancer screening. You may have this screening every year starting at age 36 if you have a 30-pack-year history of smoking and currently smoke or have quit within the past 15 years. Fecal occult blood test (FOBT) of the stool. You may have this test every year starting at age 48. Flexible sigmoidoscopy or colonoscopy. You may have a sigmoidoscopy every 5 years or a colonoscopy every 10 years starting at age 56. Prostate cancer screening. Recommendations will vary depending on your family history and other risks. Hepatitis C blood test. Hepatitis B blood test. Sexually transmitted disease (STD) testing. Diabetes screening. This is done by checking your blood sugar (glucose) after you have not eaten for a while (fasting). You may have this done every 1-3 years. Abdominal aortic aneurysm (AAA) screening. You may need this if you are a current or former smoker. Osteoporosis. You may be screened starting at age 98 if you are at high risk. Talk with your health care provider about your test results, treatment options, and if necessary, the need for more tests. Vaccines  Your health care provider may recommend certain  vaccines, such as: Influenza vaccine. This is recommended every year. Tetanus, diphtheria, and acellular pertussis (Tdap, Td) vaccine. You may need a Td booster every 10 years. Zoster vaccine. You may need this after age 79. Pneumococcal 13-valent conjugate (PCV13) vaccine. One dose is recommended after age 48. Pneumococcal  polysaccharide (PPSV23) vaccine. One dose is recommended after age 62. Talk to your health care provider about which screenings and vaccines you need and how often you need them. This information is not intended to replace advice given to you by your health care provider. Make sure you discuss any questions you have with your health care provider. Document Released: 09/19/2015 Document Revised: 05/12/2016 Document Reviewed: 06/24/2015 Elsevier Interactive Patient Education  2017 [employer].  Fall Prevention in the Home Falls can cause injuries. They can happen to people of all ages. There are many things you can do to make your home safe and to help prevent falls. What can I do on the outside of my home? Regularly fix the edges of walkways and driveways and fix any cracks. Remove anything that might make you trip as you walk through a door, such as a raised step or threshold. Trim any bushes or trees on the path to your home. Use bright outdoor lighting. Clear any walking paths of anything that might make someone trip, such as rocks or tools. Regularly check to see if handrails are loose or broken. Make sure that both sides of any steps have handrails. Any raised decks and porches should have guardrails on the edges. Have any leaves, snow, or ice cleared regularly. Use sand or salt on walking paths during winter. Clean up any spills in your garage right away. This includes oil or grease spills. What can I do in the bathroom? Use night lights. Install grab bars by the toilet and in the tub and shower. Do not use towel bars as grab bars. Use non-skid mats or decals in the tub or shower. If you need to sit down in the shower, use a plastic, non-slip stool. Keep the floor dry. Clean up any water that spills on the floor as soon as it happens. Remove soap buildup in the tub or shower regularly. Attach bath mats securely with double-sided non-slip rug tape. Do not have throw rugs and other  things on the floor that can make you trip. What can I do in the bedroom? Use night lights. Make sure that you have a light by your bed that is easy to reach. Do not use any sheets or blankets that are too big for your bed. They should not hang down onto the floor. Have a firm chair that has side arms. You can use this for support while you get dressed. Do not have throw rugs and other things on the floor that can make you trip. What can I do in the kitchen? Clean up any spills right away. Avoid walking on wet floors. Keep items that you use a lot in easy-to-reach places. If you need to reach something above you, use a strong step stool that has a grab bar. Keep electrical cords out of the way. Do not use floor polish or wax that makes floors slippery. If you must use wax, use non-skid floor wax. Do not have throw rugs and other things on the floor that can make you trip. What can I do with my stairs? Do not leave any items on the stairs. Make sure that there are handrails on both sides of the stairs  and use them. Fix handrails that are broken or loose. Make sure that handrails are as long as the stairways. Check any carpeting to make sure that it is firmly attached to the stairs. Fix any carpet that is loose or worn. Avoid having throw rugs at the top or bottom of the stairs. If you do have throw rugs, attach them to the floor with carpet tape. Make sure that you have a light switch at the top of the stairs and the bottom of the stairs. If you do not have them, ask someone to add them for you. What else can I do to help prevent falls? Wear shoes that: Do not have high heels. Have rubber bottoms. Are comfortable and fit you well. Are closed at the toe. Do not wear sandals. If you use a stepladder: Make sure that it is fully opened. Do not climb a closed stepladder. Make sure that both sides of the stepladder are locked into place. Ask someone to hold it for you, if possible. Clearly  mark and make sure that you can see: Any grab bars or handrails. First and last steps. Where the edge of each step is. Use tools that help you move around (mobility aids) if they are needed. These include: Canes. Walkers. Scooters. Crutches. Turn on the lights when you go into a dark area. Replace any light bulbs as soon as they burn out. Set up your furniture so you have a clear path. Avoid moving your furniture around. If any of your floors are uneven, fix them. If there are any pets around you, be aware of where they are. Review your medicines with your doctor. Some medicines can make you feel dizzy. This can increase your chance of falling. Ask your doctor what other things that you can do to help prevent falls. This information is not intended to replace advice given to you by your health care provider. Make sure you discuss any questions you have with your health care provider. Document Released: 06/19/2009 Document Revised: 01/29/2016 Document Reviewed: 09/27/2014 Elsevier Interactive Patient Education  2017 [employer].

## 2023-06-02 ENCOUNTER — Other Ambulatory Visit: Payer: Self-pay | Admitting: Family

## 2023-06-10 ENCOUNTER — Other Ambulatory Visit: Payer: Self-pay | Admitting: Family

## 2023-06-10 DIAGNOSIS — Z1211 Encounter for screening for malignant neoplasm of colon: Secondary | ICD-10-CM

## 2023-06-10 DIAGNOSIS — Z1212 Encounter for screening for malignant neoplasm of rectum: Secondary | ICD-10-CM

## 2023-06-17 ENCOUNTER — Other Ambulatory Visit: Payer: Self-pay | Admitting: Family

## 2023-07-21 ENCOUNTER — Other Ambulatory Visit: Payer: Self-pay | Admitting: Family

## 2023-07-21 DIAGNOSIS — C44519 Basal cell carcinoma of skin of other part of trunk: Secondary | ICD-10-CM | POA: Diagnosis not present

## 2023-07-21 DIAGNOSIS — L570 Actinic keratosis: Secondary | ICD-10-CM | POA: Diagnosis not present

## 2023-07-22 ENCOUNTER — Encounter: Payer: Self-pay | Admitting: Family

## 2023-07-22 MED ORDER — FLUTICASONE PROPIONATE 50 MCG/ACT NA SUSP: 2.0000 | Freq: Every day | NASAL | 1 refills | Status: DC | PRN

## 2023-08-15 ENCOUNTER — Ambulatory Visit: Payer: Medicare Other | Admitting: Family

## 2023-08-16 ENCOUNTER — Ambulatory Visit (INDEPENDENT_AMBULATORY_CARE_PROVIDER_SITE_OTHER): Payer: Medicare Other | Admitting: Family

## 2023-08-16 VITALS — BP 130/64 | HR 72 | Temp 98.4°F | Resp 16 | Ht 69.0 in | Wt 193.0 lb

## 2023-08-16 DIAGNOSIS — I10 Essential (primary) hypertension: Secondary | ICD-10-CM | POA: Diagnosis not present

## 2023-08-16 DIAGNOSIS — R251 Tremor, unspecified: Secondary | ICD-10-CM

## 2023-08-16 DIAGNOSIS — E7800 Pure hypercholesterolemia, unspecified: Secondary | ICD-10-CM | POA: Diagnosis not present

## 2023-08-16 DIAGNOSIS — H532 Diplopia: Secondary | ICD-10-CM | POA: Diagnosis not present

## 2023-08-16 LAB — COMPREHENSIVE METABOLIC PANEL
ALT: 34 U/L (ref 0–53)
AST: 26 U/L (ref 0–37)
Albumin: 4.6 g/dL (ref 3.5–5.2)
Alkaline Phosphatase: 62 U/L (ref 39–117)
BUN: 15 mg/dL (ref 6–23)
CO2: 28 meq/L (ref 19–32)
Calcium: 9.4 mg/dL (ref 8.4–10.5)
Chloride: 104 meq/L (ref 96–112)
Creatinine, Ser: 0.77 mg/dL (ref 0.40–1.50)
GFR: 87.78 mL/min (ref >60.00)
Glucose, Bld: 104 mg/dL — ABNORMAL HIGH (ref 70–99)
Potassium: 4.1 meq/L (ref 3.5–5.1)
Sodium: 140 meq/L (ref 135–145)
Total Bilirubin: 0.7 mg/dL (ref 0.2–1.2)
Total Protein: 7 g/dL (ref 6.0–8.3)

## 2023-08-16 LAB — LIPID PANEL
Cholesterol: 174 mg/dL (ref 0–200)
HDL: 43 mg/dL (ref >39.00)
LDL Cholesterol: 86 mg/dL (ref 0–99)
NonHDL: 131.16
Total CHOL/HDL Ratio: 4
Triglycerides: 228 mg/dL — ABNORMAL HIGH (ref 0.0–149.0)
VLDL: 45.6 mg/dL — ABNORMAL HIGH (ref 0.0–40.0)

## 2023-08-16 MED ORDER — PROPRANOLOL HCL 20 MG PO TABS: 20.0000 mg | ORAL_TABLET | Freq: Two times a day (BID) | ORAL | 1 refills | Status: DC

## 2023-08-16 NOTE — Assessment & Plan Note (Signed)
New. Has been addressed by his eye doctor and he is awaiting new glasses.

## 2023-08-16 NOTE — Assessment & Plan Note (Signed)
Update lipid panel, continue pravastatin. Was stable last year.

## 2023-08-16 NOTE — Progress Notes (Signed)
Subjective:     Patient ID: Leroy Horton, male    DOB: 1946-08-09, 78 y.o.   MRN: [id number]  Chief Complaint  Patient presents with   Hypertension    Here for follow up   Hyperlipidemia    Here for follow up     Discussed the use of AI scribe software for clinical note transcription with the patient, who gave verbal consent to proceed.  History of Present Illness   The patient, with a history of hyperlipidemia, hypertension, and gastroesophageal reflux disease, presents with a longstanding tremor that has been worsening. The tremor is primarily in the right leg and is severe enough to interfere with driving. He has previously seen a neurologist at the [place] who suggested it may be a familial tremor and did not believe it was Parkinson's disease. The patient also reports new onset double vision, which is worse with distance and when staring at a screen or altar. He has been evaluated by an ophthalmologist who diagnosed a lazy eye and prescribed new glasses, which have not yet arrived. He states that his neurologist is also aware of the double vision.   The patient's cholesterol has been well controlled on pravastatin, but has not been checked in a year. He also takes amlodipine and losartan for hypertension, which was slightly elevated at the last visit. His reflux is well controlled on pantoprazole and famotidine, which he has recently switched to taking at night.       Lab Results  Component Value Date   CHOL 190 08/11/2022   HDL 49.80 08/11/2022   LDLCALC 121 (H) 08/11/2022   TRIG 94.0 08/11/2022   CHOLHDL 4 08/11/2022   BP Readings from Last 3 Encounters:  08/16/23 130/64  02/11/23 130/65  08/11/22 139/84      Health Maintenance Due  Topic Date Due   COVID-19 Vaccine (7 - 2023-24 season) 07/03/2023    Past Medical History:  Diagnosis Date   Acid reflux    Allergy    Anemia    Arthritis    Blood transfusion without reported diagnosis    Cancer (HCC)    skin  (basal cell multiple) followed by [facility].   Cataract    Duodenal ulcer    Glaucoma    H/O agent Orange exposure    Heart murmur    Hiatal hernia - 10 cm w/ Sheria Lang erosions 06/28/2017   High cholesterol    Hypertension    Multiple duodenal ulcers     Past Surgical History:  Procedure Laterality Date   APPENDECTOMY     CAPSULOTOMY     CATARACT EXTRACTION Bilateral 2013   ESOPHAGOGASTRODUODENOSCOPY N/A 06/28/2017   Procedure: ESOPHAGOGASTRODUODENOSCOPY (EGD);  Surgeon: Iva Boop, MD;  Location: [facility] ENDOSCOPY;  Service: Endoscopy;  Laterality: N/A;   HAND SURGERY Right 2015   duputrens contracture   ROTATOR CUFF REPAIR Left 2005    Family History  Problem Relation Age of Onset   Hypertension Mother    Ulcers Mother    Hypertension Father    Ulcers Father    Hiatal hernia Sister    Aneurysm Brother        brain   Cancer Neg Hx     Social History   Socioeconomic History   Marital status: Married    Spouse name: Elease Hashimoto   Number of children: Not on file   Years of education: Not on file   Highest education level: Associate degree: occupational, Scientist, product/process development, or vocational program  Occupational History   Occupation: retired    Comment: Estate agent  Tobacco Use   Smoking status: Former    Current packs/day: 0.00    Types: Cigarettes, Cigars    Quit date: 1980    Years since quitting: 44.9   Smokeless tobacco: Never  Substance and Sexual Activity   Alcohol use: Yes    Comment: 3-4 x a week will have 3-4 drinks   Drug use: Not Currently    Types: Marijuana   Sexual activity: Yes    Partners: Female    Birth control/protection: None  Other Topics Concern   Not on file  Social History Narrative   Retired from Estate agent (Cabin crew)   Wife, Programmer, systems, 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)   Completed trade school (2 years)   Enjoys Diplomatic Services operational officer, yard work   2 sons (both local)   1 grand daughter    No pets    Social Determinants of Health   Financial Resource Strain: Low Risk  (08/10/2023)   Overall Financial Resource Strain (CARDIA)    Difficulty of Paying Living Expenses: Not hard at all  Food Insecurity: No Food Insecurity (08/10/2023)   Hunger Vital Sign    Worried About Running Out of Food in the Last Year: Never true    Ran Out of Food in the Last Year: Never true  Transportation Needs: No Transportation Needs (08/10/2023)   PRAPARE - Administrator, Civil Service (Medical): No    Lack of Transportation (Non-Medical): No  Physical Activity: Sufficiently Active (08/10/2023)   Exercise Vital Sign    Days of Exercise per Week: 5 days    Minutes of Exercise per Session: 50 min  Stress: No Stress Concern Present (08/10/2023)   [employer] of Occupational Health - Occupational Stress Questionnaire    Feeling of Stress : Only a little  Social Connections: Moderately Integrated (08/10/2023)   Social Connection and Isolation Panel [NHANES]    Frequency of Communication with Friends and Family: More than three times a week    Frequency of Social Gatherings with Friends and Family: Twice a week    Attends Religious Services: More than 4 times per year    Active Member of [employer] or Organizations: No    Attends Banker Meetings: Never    Marital Status: Married  Catering manager Violence: Not At Risk (05/18/2023)   Humiliation, Afraid, Rape, and Kick questionnaire    Fear of Current or Ex-Partner: No    Emotionally Abused: No    Physically Abused: No    Sexually Abused: No    Outpatient Medications Prior to Visit  Medication Sig Dispense Refill   amLODipine (NORVASC) 10 MG tablet TAKE 1 TABLET BY MOUTH DAILY 100 tablet 1   CALCIUM MAGNESIUM ZINC PO Take by mouth.     Cholecalciferol (VITAMIN D3) 50 MCG (2000 UT) capsule Take 2,000 Units by mouth daily.     Coenzyme Q10 (COQ-10) 200 MG CAPS Take 200 mcg by mouth daily.     famotidine (PEPCID) 40 MG  tablet TAKE 1 TABLET BY MOUTH DAILY 100 tablet 2   fluticasone (FLONASE) 50 MCG/ACT nasal spray Place 2 sprays into both nostrils daily as needed for allergies or rhinitis. 48 g 1   losartan (COZAAR) 100 MG tablet TAKE 1 TABLET BY MOUTH DAILY 100 tablet 1   Multiple Vitamin (MULTIVITAMIN WITH MINERALS) TABS tablet Take 1 tablet by mouth daily.  pantoprazole (PROTONIX) 40 MG tablet TAKE 1 TABLET BY MOUTH DAILY  BEFORE BREAKFAST 100 tablet 2   pravastatin (PRAVACHOL) 20 MG tablet TAKE 1 TABLET BY MOUTH DAILY 100 tablet 2   vitamin B-12 (CYANOCOBALAMIN) 500 MCG tablet Take 500 mcg by mouth daily.     No facility-administered medications prior to visit.    Allergies  Allergen Reactions   Lisinopril Cough   Sulfa Antibiotics    Sulfamethoxazole Rash    ROS See HPI    Objective:    Physical Exam Constitutional:      General: He is not in acute distress.    Appearance: He is well-developed.  HENT:     Head: Normocephalic and atraumatic.  Cardiovascular:     Rate and Rhythm: Normal rate and regular rhythm.     Heart sounds: No murmur heard. Pulmonary:     Effort: Pulmonary effort is normal. No respiratory distress.     Breath sounds: Normal breath sounds. No wheezing or rales.  Skin:    General: Skin is warm and dry.  Neurological:     Mental Status: He is alert and oriented to person, place, and time.     Comments: RUE resting tremor noted  Psychiatric:        Behavior: Behavior normal.        Thought Content: Thought content normal.      BP 130/64   Pulse 72   Temp 98.4 F (36.9 C) (Oral)   Resp 16   Ht 5\' 9"  (1.753 m)   Wt 193 lb (87.5 kg)   SpO2 95%   BMI 28.50 kg/m  Wt Readings from Last 3 Encounters:  08/16/23 193 lb (87.5 kg)  02/11/23 193 lb 3.2 oz (87.6 kg)  08/11/22 191 lb 6 oz (86.8 kg)       Assessment & Plan:   Problem List Items Addressed This Visit       Unprioritized   Tremor    Worsening, I advised him to follow up with neurology and  will give a trial of propranolol.      Relevant Medications   propranolol (INDERAL) 20 MG tablet   Hypertension    BP stable on amlodipine and losartan. Continue same.       Relevant Medications   propranolol (INDERAL) 20 MG tablet   High cholesterol - Primary    Update lipid panel, continue pravastatin. Was stable last year.       Relevant Medications   propranolol (INDERAL) 20 MG tablet   Other Relevant Orders   Lipid panel   Comp Met (CMET)   Diplopia    New. Has been addressed by his eye doctor and he is awaiting new glasses.        I am having Mauri Brooklyn start on propranolol. I am also having him maintain his Vitamin D3, vitamin B-12, CoQ-10, CALCIUM MAGNESIUM ZINC PO, multivitamin with minerals, pravastatin, pantoprazole, famotidine, losartan, amLODipine, and fluticasone.  Meds ordered this encounter  Medications   propranolol (INDERAL) 20 MG tablet    Sig: Take 1 tablet (20 mg total) by mouth 2 (two) times daily.    Dispense:  180 tablet    Refill:  1    Order Specific Question:   Supervising Provider    Answer:   Danise Edge A [[id number]]

## 2023-08-16 NOTE — Assessment & Plan Note (Signed)
BP stable on amlodipine and losartan. Continue same.

## 2023-08-16 NOTE — Assessment & Plan Note (Signed)
Worsening, I advised him to follow up with neurology and will give a trial of propranolol.

## 2023-08-16 NOTE — Patient Instructions (Signed)
VISIT SUMMARY:  During today's visit, we discussed your longstanding tremor, new onset double vision, and reviewed your management for hyperlipidemia, hypertension, and gastroesophageal reflux disease (GERD). We also addressed general health maintenance, including vaccinations and routine lab tests.  YOUR PLAN:  -ESSENTIAL TREMOR: Essential tremor is a nervous system disorder that causes involuntary and rhythmic shaking, often in the hands. We discussed starting you on a low dose of Propranolol to help manage the tremor. Please take this medication twice a day and monitor for any side effects. We will follow up in 6 weeks to assess how well the medication is working and to check your blood pressure.  -HYPERLIPIDEMIA: Hyperlipidemia is a condition where there are high levels of fats (lipids) in your blood. Your cholesterol has been well controlled on Pravastatin, but it has not been checked in a year. We will order a lipid panel today to check your cholesterol levels. Please continue taking Pravastatin at night.  -HYPERTENSION: Hypertension is high blood pressure. Your blood pressure was slightly elevated at your last visit. We will recheck it today and continue your current medications, Amlodipine and Losartan.  -GASTROESOPHAGEAL REFLUX DISEASE (GERD): GERD is a digestive disorder where stomach acid irritates the food pipe lining. Your condition is well controlled with Pantoprazole and Famotidine. Please continue taking Pantoprazole in the morning and Famotidine at night.  -DOUBLE VISION: Double vision is seeing two images of a single object. You have been diagnosed with a lazy eye and have been prescribed new glasses. Please continue to follow up with your neurologist and use the new glasses as needed.  -GENERAL HEALTH MAINTENANCE: We administered the RSV vaccine today and ordered a kidney function test. You declined colon cancer screening at this time.  INSTRUCTIONS:  Please follow up in 6  weeks to assess the response to Propranolol and to recheck your blood pressure. Additionally, ensure to get your lipid panel done today and continue with your current medications as discussed.

## 2023-08-17 ENCOUNTER — Telehealth: Payer: Self-pay | Admitting: Family

## 2023-08-17 DIAGNOSIS — R739 Hyperglycemia, unspecified: Secondary | ICD-10-CM

## 2023-08-17 DIAGNOSIS — E781 Pure hyperglyceridemia: Secondary | ICD-10-CM

## 2023-08-17 NOTE — Telephone Encounter (Signed)
Patient notified of results and provider's advise. He was scheduled for labs tomorrow.

## 2023-08-17 NOTE — Telephone Encounter (Signed)
Sugar is mildly elevated.  I would like to have him return to the lab for A1C to further evaluate his sugar.  Your triglycerides are mildly elevated.  Please work on avoiding concentrated sweets, and limiting white carbs (rice/bread/pasta/potatoes).  Instead substitute whole grain versions with reasonable portions.

## 2023-08-18 ENCOUNTER — Other Ambulatory Visit (INDEPENDENT_AMBULATORY_CARE_PROVIDER_SITE_OTHER): Payer: Medicare Other

## 2023-08-18 DIAGNOSIS — R739 Hyperglycemia, unspecified: Secondary | ICD-10-CM

## 2023-08-18 LAB — HEMOGLOBIN A1C: Hgb A1c MFr Bld: 5.7 % (ref 4.6–6.5)

## 2023-09-07 DIAGNOSIS — H35361 Drusen (degenerative) of macula, right eye: Secondary | ICD-10-CM

## 2023-09-07 HISTORY — DX: Drusen (degenerative) of macula, right eye: H35.361

## 2023-09-28 ENCOUNTER — Ambulatory Visit: Payer: Medicare Other | Admitting: Family

## 2023-09-28 VITALS — BP 133/66 | HR 63 | Temp 97.7°F | Resp 16 | Ht 69.0 in | Wt 196.0 lb

## 2023-09-28 DIAGNOSIS — K219 Gastro-esophageal reflux disease without esophagitis: Secondary | ICD-10-CM | POA: Diagnosis not present

## 2023-09-28 DIAGNOSIS — I10 Essential (primary) hypertension: Secondary | ICD-10-CM | POA: Diagnosis not present

## 2023-09-28 DIAGNOSIS — E7800 Pure hypercholesterolemia, unspecified: Secondary | ICD-10-CM

## 2023-09-28 DIAGNOSIS — R251 Tremor, unspecified: Secondary | ICD-10-CM | POA: Diagnosis not present

## 2023-09-28 MED ORDER — FLUTICASONE PROPIONATE 50 MCG/ACT NA SUSP: 2.0000 | Freq: Every day | NASAL | 1 refills | Status: DC | PRN

## 2023-09-28 NOTE — Assessment & Plan Note (Signed)
BP Readings from Last 3 Encounters:  09/28/23 133/66  08/16/23 130/64  02/11/23 130/65   At goal- continue amlodipine and losartan.

## 2023-09-28 NOTE — Progress Notes (Signed)
Subjective:     Patient ID: Leroy Horton, male    DOB: July 21, 1946, 78 y.o.   MRN: [id number]  Chief Complaint  Patient presents with   Hypertension    Here for follow up    Hypertension    Discussed the use of AI scribe software for clinical note transcription with the patient, who gave verbal consent to proceed.  History of Present Illness   The patient, with a history of essential tremor, hypertension, hyperlipidemia, and gastroesophageal reflux disease (GERD), presents for a follow-up visit. He reports significant improvement in his tremor since starting propranolol, noting that his hands and leg no longer shake and he is sleeping better. He also has hypertension and hyperlipidemia, which are managed with amlodipine, losartan, and pravastatin. He has been trying to reduce his carbohydrate and sugar intake to lower his triglyceride levels. His GERD is managed with Pepcid and Protonix, which he takes in the morning and evening, respectively. He has a history of Cameron erosions. He is also under the care of dermatology for skin cancer screening.          There are no preventive care reminders to display for this patient.   Past Medical History:  Diagnosis Date   Acid reflux    Allergy    Anemia    Arthritis    Blood transfusion without reported diagnosis    Cancer (HCC)    skin (basal cell multiple) followed by [facility].   Cataract    Duodenal ulcer    Glaucoma    H/O agent Orange exposure    Heart murmur    Hiatal hernia - 10 cm w/ Sheria Lang erosions 06/28/2017   High cholesterol    Hypertension    Multiple duodenal ulcers     Past Surgical History:  Procedure Laterality Date   APPENDECTOMY     CAPSULOTOMY     CATARACT EXTRACTION Bilateral 2013   ESOPHAGOGASTRODUODENOSCOPY N/A 06/28/2017   Procedure: ESOPHAGOGASTRODUODENOSCOPY (EGD);  Surgeon: Iva Boop, MD;  Location: [facility] ENDOSCOPY;  Service: Endoscopy;  Laterality: N/A;   HAND SURGERY  Right 2015   duputrens contracture   ROTATOR CUFF REPAIR Left 2005    Family History  Problem Relation Age of Onset   Hypertension Mother    Ulcers Mother    Hypertension Father    Ulcers Father    Hiatal hernia Sister    Aneurysm Brother        brain   Cancer Neg Hx     Social History   Socioeconomic History   Marital status: Married    Spouse name: Elease Hashimoto   Number of children: Not on file   Years of education: Not on file   Highest education level: Associate degree: occupational, Scientist, product/process development, or vocational program  Occupational History   Occupation: retired    Comment: Estate agent  Tobacco Use   Smoking status: Former    Current packs/day: 0.00    Types: Cigarettes, Cigars    Quit date: 1980    Years since quitting: 45.0   Smokeless tobacco: Never  Substance and Sexual Activity   Alcohol use: Yes    Comment: 3-4 x a week will have 3-4 drinks   Drug use: Not Currently    Types: Marijuana   Sexual activity: Yes    Partners: Female    Birth control/protection: None  Other Topics Concern   Not on file  Social History Narrative   Retired from Estate agent (Cabin crew)   Wife, Elease Hashimoto, teaches  at [employer]- Company secretary ([place] vet)   Completed trade school (2 years)   Enjoys Diplomatic Services operational officer, yard work   2 sons (both local)   1 grand daughter   No pets    Social Drivers of Corporate investment banker Strain: Low Risk  (09/21/2023)   Overall Financial Resource Strain (CARDIA)    Difficulty of Paying Living Expenses: Not hard at all  Food Insecurity: No Food Insecurity (09/21/2023)   Hunger Vital Sign    Worried About Running Out of Food in the Last Year: Never true    Ran Out of Food in the Last Year: Never true  Transportation Needs: No Transportation Needs (09/21/2023)   PRAPARE - Administrator, Civil Service (Medical): No    Lack of Transportation (Non-Medical): No  Physical Activity: Sufficiently Active  (09/21/2023)   Exercise Vital Sign    Days of Exercise per Week: 5 days    Minutes of Exercise per Session: 50 min  Stress: No Stress Concern Present (09/21/2023)   [employer] of Occupational Health - Occupational Stress Questionnaire    Feeling of Stress : Only a little  Social Connections: Moderately Integrated (09/21/2023)   Social Connection and Isolation Panel [NHANES]    Frequency of Communication with Friends and Family: More than three times a week    Frequency of Social Gatherings with Friends and Family: Once a week    Attends Religious Services: More than 4 times per year    Active Member of [employer] or Organizations: No    Attends Banker Meetings: Never    Marital Status: Married  Catering manager Violence: Not At Risk (05/18/2023)   Humiliation, Afraid, Rape, and Kick questionnaire    Fear of Current or Ex-Partner: No    Emotionally Abused: No    Physically Abused: No    Sexually Abused: No    Outpatient Medications Prior to Visit  Medication Sig Dispense Refill   amLODipine (NORVASC) 10 MG tablet TAKE 1 TABLET BY MOUTH DAILY 100 tablet 1   CALCIUM MAGNESIUM ZINC PO Take by mouth.     Cholecalciferol (VITAMIN D3) 50 MCG (2000 UT) capsule Take 2,000 Units by mouth daily.     Coenzyme Q10 (COQ-10) 200 MG CAPS Take 200 mcg by mouth daily.     famotidine (PEPCID) 40 MG tablet TAKE 1 TABLET BY MOUTH DAILY 100 tablet 2   losartan (COZAAR) 100 MG tablet TAKE 1 TABLET BY MOUTH DAILY 100 tablet 1   Multiple Vitamin (MULTIVITAMIN WITH MINERALS) TABS tablet Take 1 tablet by mouth daily.     pantoprazole (PROTONIX) 40 MG tablet TAKE 1 TABLET BY MOUTH DAILY  BEFORE BREAKFAST 100 tablet 2   pravastatin (PRAVACHOL) 20 MG tablet TAKE 1 TABLET BY MOUTH DAILY 100 tablet 2   propranolol (INDERAL) 20 MG tablet Take 1 tablet (20 mg total) by mouth 2 (two) times daily. 180 tablet 1   vitamin B-12 (CYANOCOBALAMIN) 500 MCG tablet Take 500 mcg by mouth daily.     fluticasone  (FLONASE) 50 MCG/ACT nasal spray Place 2 sprays into both nostrils daily as needed for allergies or rhinitis. 48 g 1   No facility-administered medications prior to visit.    Allergies  Allergen Reactions   Lisinopril Cough   Sulfa Antibiotics    Sulfamethoxazole Rash    ROS See HPI    Objective:    Physical Exam Constitutional:      General: He  is not in acute distress.    Appearance: He is well-developed.  HENT:     Head: Normocephalic and atraumatic.  Cardiovascular:     Rate and Rhythm: Normal rate and regular rhythm.     Heart sounds: No murmur heard. Pulmonary:     Effort: Pulmonary effort is normal. No respiratory distress.     Breath sounds: Normal breath sounds. No wheezing or rales.  Skin:    General: Skin is warm and dry.  Neurological:     Mental Status: He is alert and oriented to person, place, and time.  Psychiatric:        Behavior: Behavior normal.        Thought Content: Thought content normal.      BP 133/66 (BP Location: Right Arm, Patient Position: Sitting, Cuff Size: Small)   Pulse 63   Temp 97.7 F (36.5 C) (Oral)   Resp 16   Ht 5\' 9"  (1.753 m)   Wt 196 lb (88.9 kg)   SpO2 99%   BMI 28.94 kg/m  Wt Readings from Last 3 Encounters:  09/28/23 196 lb (88.9 kg)  08/16/23 193 lb (87.5 kg)  02/11/23 193 lb 3.2 oz (87.6 kg)       Assessment & Plan:   Problem List Items Addressed This Visit       Unprioritized   Tremor - Primary   Nearly resolved on propranolol. Will continue same.       Hypertension   BP Readings from Last 3 Encounters:  09/28/23 133/66  08/16/23 130/64  02/11/23 130/65   At goal- continue amlodipine and losartan.       High cholesterol   Lab Results  Component Value Date   CHOL 174 08/16/2023   HDL 43.00 08/16/2023   LDLCALC 86 08/16/2023   TRIG 228.0 (H) 08/16/2023   CHOLHDL 4 08/16/2023   LDL stable, continues pravastatin and trying to limit carbs/sweets.       GERD (gastroesophageal reflux  disease)   Stable on pepcid and pantoprazole. Continue same.        I am having Mauri Brooklyn maintain his Vitamin D3, vitamin B-12, CoQ-10, CALCIUM MAGNESIUM ZINC PO, multivitamin with minerals, pravastatin, pantoprazole, famotidine, losartan, amLODipine, propranolol, and fluticasone.  Meds ordered this encounter  Medications   fluticasone (FLONASE) 50 MCG/ACT nasal spray    Sig: Place 2 sprays into both nostrils daily as needed for allergies or rhinitis.    Dispense:  48 g    Refill:  1

## 2023-09-28 NOTE — Assessment & Plan Note (Signed)
Stable on pepcid and pantoprazole. Continue same.

## 2023-09-28 NOTE — Assessment & Plan Note (Signed)
Nearly resolved on propranolol. Will continue same.

## 2023-09-28 NOTE — Assessment & Plan Note (Signed)
Lab Results  Component Value Date   CHOL 174 08/16/2023   HDL 43.00 08/16/2023   LDLCALC 86 08/16/2023   TRIG 228.0 (H) 08/16/2023   CHOLHDL 4 08/16/2023   LDL stable, continues pravastatin and trying to limit carbs/sweets.

## 2023-09-28 NOTE — Patient Instructions (Signed)
VISIT SUMMARY:  During today's visit, we reviewed your ongoing health conditions, including essential tremor, hypertension, hyperlipidemia, gastroesophageal reflux disease (GERD), and your skin cancer surveillance. You reported significant improvement in your tremor since starting propranolol, and your blood pressure and GERD symptoms are well-controlled with your current medications. We also discussed your efforts to manage your hyperlipidemia through dietary changes.  YOUR PLAN:  -ESSENTIAL TREMOR: Essential tremor is a nervous system disorder that causes involuntary and rhythmic shaking. You have reported significant improvement with Propranolol, and you should continue taking it as prescribed. Ensure you have enough refills.  -HYPERLIPIDEMIA: Hyperlipidemia is a condition where there are high levels of fats (lipids) in your blood. Your last lipid panel showed elevated triglycerides, and you are making dietary changes to address this. Continue taking Pravastatin and maintain your efforts to reduce carbohydrate and sugar intake.  -HYPERTENSION: Hypertension is high blood pressure. Your blood pressure is well-controlled with your current medications. Continue taking Amlodipine and Losartan as prescribed.  -GASTROESOPHAGEAL REFLUX DISEASE (GERD) WITH HISTORY OF CAMERON EROSIONS: GERD is a digestive disorder where stomach acid irritates the food pipe lining. Your symptoms are well-controlled with Pepcid and Protonix. Continue taking these medications as directed.  -SKIN CANCER SURVEILLANCE: You are under regular surveillance for skin cancer with your dermatologist. Continue attending your regular dermatology appointments.  -ALLERGIC RHINITIS: Allergic rhinitis is an allergic reaction that causes sneezing, congestion, and a runny nose. Your Flonase prescription has been refilled. Use it as directed to manage your symptoms.  -GENERAL HEALTH MAINTENANCE: You are up to date on your COVID-19 booster and  influenza vaccine. Continue following your current immunization schedule.  INSTRUCTIONS:  Please follow up in 6 months for your next appointment.

## 2023-10-24 ENCOUNTER — Other Ambulatory Visit: Payer: Self-pay | Admitting: Family

## 2023-10-24 DIAGNOSIS — R251 Tremor, unspecified: Secondary | ICD-10-CM

## 2023-11-20 ENCOUNTER — Encounter: Payer: Self-pay | Admitting: Family

## 2023-11-21 ENCOUNTER — Other Ambulatory Visit: Payer: Self-pay

## 2023-11-21 MED ORDER — PANTOPRAZOLE SODIUM 40 MG PO TBEC: 40.0000 mg | DELAYED_RELEASE_TABLET | Freq: Every day | ORAL | 2 refills | Status: DC

## 2023-11-21 MED ORDER — LOSARTAN POTASSIUM 100 MG PO TABS: 100.0000 mg | ORAL_TABLET | Freq: Every day | ORAL | 1 refills | Status: DC

## 2023-11-21 MED ORDER — AMLODIPINE BESYLATE 10 MG PO TABS: 10.0000 mg | ORAL_TABLET | Freq: Every day | ORAL | 1 refills | Status: DC

## 2023-11-21 MED ORDER — PRAVASTATIN SODIUM 20 MG PO TABS: 20.0000 mg | ORAL_TABLET | Freq: Every day | ORAL | 2 refills | Status: DC

## 2023-11-21 MED ORDER — FLUTICASONE PROPIONATE 50 MCG/ACT NA SUSP: 2.0000 | Freq: Every day | NASAL | 5 refills | Status: DC | PRN

## 2024-01-19 DIAGNOSIS — L821 Other seborrheic keratosis: Secondary | ICD-10-CM | POA: Diagnosis not present

## 2024-01-19 DIAGNOSIS — L570 Actinic keratosis: Secondary | ICD-10-CM | POA: Diagnosis not present

## 2024-01-21 ENCOUNTER — Encounter: Payer: Self-pay | Admitting: Family

## 2024-01-22 MED ORDER — FAMOTIDINE 40 MG PO TABS: 40.0000 mg | ORAL_TABLET | Freq: Every day | ORAL | 1 refills | Status: DC

## 2024-03-19 ENCOUNTER — Ambulatory Visit: Payer: Medicare Other | Admitting: Family

## 2024-03-21 ENCOUNTER — Ambulatory Visit (INDEPENDENT_AMBULATORY_CARE_PROVIDER_SITE_OTHER): Admitting: Family

## 2024-03-21 VITALS — BP 138/75 | HR 61 | Temp 97.8°F | Resp 16 | Ht 67.0 in | Wt 192.0 lb

## 2024-03-21 DIAGNOSIS — Z125 Encounter for screening for malignant neoplasm of prostate: Secondary | ICD-10-CM

## 2024-03-21 DIAGNOSIS — H532 Diplopia: Secondary | ICD-10-CM

## 2024-03-21 DIAGNOSIS — K219 Gastro-esophageal reflux disease without esophagitis: Secondary | ICD-10-CM

## 2024-03-21 DIAGNOSIS — I10 Essential (primary) hypertension: Secondary | ICD-10-CM | POA: Diagnosis not present

## 2024-03-21 DIAGNOSIS — E7800 Pure hypercholesterolemia, unspecified: Secondary | ICD-10-CM

## 2024-03-21 LAB — BASIC METABOLIC PANEL WITH GFR
BUN: 18 mg/dL (ref 6–23)
CO2: 27 meq/L (ref 19–32)
Calcium: 9.7 mg/dL (ref 8.4–10.5)
Chloride: 104 meq/L (ref 96–112)
Creatinine, Ser: 0.84 mg/dL (ref 0.40–1.50)
GFR: 85.15 mL/min (ref >60.00)
Glucose, Bld: 99 mg/dL (ref 70–99)
Potassium: 4.6 meq/L (ref 3.5–5.1)
Sodium: 139 meq/L (ref 135–145)

## 2024-03-21 LAB — PSA: PSA: 4.83 ng/mL — ABNORMAL HIGH (ref 0.10–4.00)

## 2024-03-21 MED ORDER — LOSARTAN POTASSIUM 100 MG PO TABS: 100.0000 mg | ORAL_TABLET | Freq: Every day | ORAL | 1 refills | Status: AC

## 2024-03-21 MED ORDER — AMLODIPINE BESYLATE 10 MG PO TABS: 10.0000 mg | ORAL_TABLET | Freq: Every day | ORAL | 1 refills | Status: DC

## 2024-03-21 MED ORDER — FAMOTIDINE 40 MG PO TABS: 40.0000 mg | ORAL_TABLET | Freq: Every day | ORAL | 1 refills | Status: DC

## 2024-03-21 NOTE — Assessment & Plan Note (Addendum)
 Stable on pepcid and pantoprazole. Continue same.

## 2024-03-21 NOTE — Assessment & Plan Note (Addendum)
 Maintained on amlodipine , losartan  and propranol.   BP Readings from Last 3 Encounters:  03/21/24 138/75  09/28/23 133/66  08/16/23 130/64   BP at goal. Continue current meds.

## 2024-03-21 NOTE — Assessment & Plan Note (Signed)
 Lab Results  Component Value Date   CHOL 174 08/16/2023   HDL 43.00 08/16/2023   LDLCALC 86 08/16/2023   TRIG 228.0 (H) 08/16/2023   CHOLHDL 4 08/16/2023  LDL at goal. Discussed dietary changes to lower triglycerides.

## 2024-03-21 NOTE — Assessment & Plan Note (Signed)
 Resolved with new eye glass rx.

## 2024-03-21 NOTE — Progress Notes (Signed)
 Subjective:     Patient ID: Leroy Horton, male    DOB: [birth year], 78 y.o.   MRN: [id number]  Chief Complaint  Patient presents with   Hypertension    Here for follow up    Hypertension    Discussed the use of AI scribe software for clinical note transcription with the patient, who gave verbal consent to proceed.  History of Present Illness   Patient is a 78 yr old male who presents today for follow up.  Reports bp at home has been stable.  Thalia- taking pepcid  and pantoprazole . Takes on in the AM and one in the PM- otherwise he has symptoms. He continues statin.  Diplopia is resolved with new eyeglass rx. He continues to decline all colon cancer screening. He would like PSA check today.    Health Maintenance Due  Topic Date Due   Medicare Annual Wellness (AWV)  05/17/2024    Past Medical History:  Diagnosis Date   Acid reflux    Allergy    Anemia    Arthritis    Blood transfusion without reported diagnosis    Cancer (HCC)    skin (basal cell multiple) followed by [facility].   Cataract    Duodenal ulcer    Glaucoma    H/O agent Orange exposure    Heart murmur    Hiatal hernia - 10 cm w/ Ole erosions 06/28/2017   High cholesterol    Hypertension    Multiple duodenal ulcers     Past Surgical History:  Procedure Laterality Date   APPENDECTOMY     CAPSULOTOMY     CATARACT EXTRACTION Bilateral 2013   ESOPHAGOGASTRODUODENOSCOPY N/A 06/28/2017   Procedure: ESOPHAGOGASTRODUODENOSCOPY (EGD);  Surgeon: Avram Lupita BRAVO, MD;  Location: [facility] ENDOSCOPY;  Service: Endoscopy;  Laterality: N/A;   HAND SURGERY Right 2015   duputrens contracture   ROTATOR CUFF REPAIR Left 2005    Family History  Problem Relation Age of Onset   Hypertension Mother    Ulcers Mother    Hypertension Father    Ulcers Father    Hiatal hernia Sister    Aneurysm Brother        brain   Cancer Neg Hx     Social History   Socioeconomic History   Marital status: Married     Spouse name: Avelina   Number of children: Not on file   Years of education: Not on file   Highest education level: Associate degree: occupational, Scientist, product/process development, or vocational program  Occupational History   Occupation: retired    Comment: Estate agent  Tobacco Use   Smoking status: Former    Current packs/day: 0.00    Types: Cigarettes, Cigars    Quit date: 1980    Years since quitting: 45.5   Smokeless tobacco: Never  Substance and Sexual Activity   Alcohol use: Yes    Comment: 3-4 x a week will have 3-4 drinks   Drug use: Not Currently    Types: Marijuana   Sexual activity: Yes    Partners: Female    Birth control/protection: None  Other Topics Concern   Not on file  Social History Narrative   Retired from Estate agent (Cabin crew)   Wife, Programmer, systems, 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)   Completed trade school (2 years)   Enjoys Diplomatic Services operational officer, yard work   2 sons (both local)   1 grand daughter   No pets    Social  Drivers of Corporate investment banker Strain: Low Risk  (03/14/2024)   Overall Financial Resource Strain (CARDIA)    Difficulty of Paying Living Expenses: Not hard at all  Food Insecurity: No Food Insecurity (03/14/2024)   Hunger Vital Sign    Worried About Running Out of Food in the Last Year: Never true    Ran Out of Food in the Last Year: Never true  Transportation Needs: No Transportation Needs (03/14/2024)   PRAPARE - Administrator, Civil Service (Medical): No    Lack of Transportation (Non-Medical): No  Physical Activity: Sufficiently Active (03/14/2024)   Exercise Vital Sign    Days of Exercise per Week: 5 days    Minutes of Exercise per Session: 50 min  Stress: No Stress Concern Present (03/14/2024)   [employer] of Occupational Health - Occupational Stress Questionnaire    Feeling of Stress: Only a little  Social Connections: Moderately Integrated (03/14/2024)   Social Connection and Isolation  Panel    Frequency of Communication with Friends and Family: More than three times a week    Frequency of Social Gatherings with Friends and Family: Twice a week    Attends Religious Services: More than 4 times per year    Active Member of [employer] or Organizations: No    Attends Banker Meetings: Not on file    Marital Status: Married  Catering manager Violence: Not At Risk (05/18/2023)   Humiliation, Afraid, Rape, and Kick questionnaire    Fear of Current or Ex-Partner: No    Emotionally Abused: No    Physically Abused: No    Sexually Abused: No    Outpatient Medications Prior to Visit  Medication Sig Dispense Refill   CALCIUM MAGNESIUM ZINC PO Take by mouth.     Cholecalciferol (VITAMIN D3) 50 MCG (2000 UT) capsule Take 2,000 Units by mouth daily.     Coenzyme Q10 (COQ-10) 200 MG CAPS Take 200 mcg by mouth daily.     fluticasone  (FLONASE ) 50 MCG/ACT nasal spray Place 2 sprays into both nostrils daily as needed for allergies or rhinitis. 48 g 5   Multiple Vitamin (MULTIVITAMIN WITH MINERALS) TABS tablet Take 1 tablet by mouth daily.     pantoprazole  (PROTONIX ) 40 MG tablet Take 1 tablet (40 mg total) by mouth daily before breakfast. 100 tablet 2   pravastatin  (PRAVACHOL ) 20 MG tablet Take 1 tablet (20 mg total) by mouth daily. 100 tablet 2   propranolol  (INDERAL ) 20 MG tablet Take 1 tablet (20 mg total) by mouth 2 (two) times daily. 180 tablet 2   vitamin B-12 (CYANOCOBALAMIN) 500 MCG tablet Take 500 mcg by mouth daily.     amLODipine  (NORVASC ) 10 MG tablet Take 1 tablet (10 mg total) by mouth daily. 100 tablet 1   famotidine  (PEPCID ) 40 MG tablet Take 1 tablet (40 mg total) by mouth daily. 100 tablet 1   losartan  (COZAAR ) 100 MG tablet Take 1 tablet (100 mg total) by mouth daily. 100 tablet 1   No facility-administered medications prior to visit.    Allergies  Allergen Reactions   Lisinopril Cough   Sulfa Antibiotics    Sulfamethoxazole Rash    ROS See HPI     Objective:    Physical Exam Constitutional:      General: He is not in acute distress.    Appearance: He is well-developed.  HENT:     Head: Normocephalic and atraumatic.  Cardiovascular:     Rate and Rhythm:  Normal rate and regular rhythm.     Heart sounds: No murmur heard. Pulmonary:     Effort: Pulmonary effort is normal. No respiratory distress.     Breath sounds: Normal breath sounds. No wheezing or rales.  Skin:    General: Skin is warm and dry.  Neurological:     Mental Status: He is alert and oriented to person, place, and time.  Psychiatric:        Behavior: Behavior normal.        Thought Content: Thought content normal.      BP 138/75   Pulse 61   Temp 97.8 F (36.6 C) (Oral)   Resp 16   Ht 5' 7 (1.702 m)   Wt 192 lb (87.1 kg)   SpO2 93%   BMI 30.07 kg/m  Wt Readings from Last 3 Encounters:  03/21/24 192 lb (87.1 kg)  09/28/23 196 lb (88.9 kg)  08/16/23 193 lb (87.5 kg)       Assessment & Plan:   Problem List Items Addressed This Visit       Unprioritized   Hypertension - Primary   Maintained on amlodipine , losartan  and propranol.   BP Readings from Last 3 Encounters:  03/21/24 138/75  09/28/23 133/66  08/16/23 130/64   BP at goal. Continue current meds.      Relevant Medications   losartan  (COZAAR ) 100 MG tablet   amLODipine  (NORVASC ) 10 MG tablet   Other Relevant Orders   Basic Metabolic Panel (BMET)   High cholesterol   Lab Results  Component Value Date   CHOL 174 08/16/2023   HDL 43.00 08/16/2023   LDLCALC 86 08/16/2023   TRIG 228.0 (H) 08/16/2023   CHOLHDL 4 08/16/2023  LDL at goal. Discussed dietary changes to lower triglycerides.        Relevant Medications   losartan  (COZAAR ) 100 MG tablet   amLODipine  (NORVASC ) 10 MG tablet   GERD (gastroesophageal reflux disease)   Stable on pepcid  and pantoprazole .  Continue same.      Relevant Medications   famotidine  (PEPCID ) 40 MG tablet   Diplopia   Resolved with new eye  glass rx.        Other Visit Diagnoses       Screening for prostate cancer       Relevant Orders   PSA       I am having Leroy Horton maintain his Vitamin D3, vitamin B-12, CoQ-10, CALCIUM MAGNESIUM ZINC PO, multivitamin with minerals, propranolol , fluticasone , pravastatin , pantoprazole , losartan , famotidine , and amLODipine .  Meds ordered this encounter  Medications   losartan  (COZAAR ) 100 MG tablet    Sig: Take 1 tablet (100 mg total) by mouth daily.    Dispense:  100 tablet    Refill:  1    Please send a replace/new response with 100-Day Supply if appropriate to maximize member benefit. Requesting 1 year supply.    Supervising Provider:   DOMENICA BLACKBIRD A [[id number]]   famotidine  (PEPCID ) 40 MG tablet    Sig: Take 1 tablet (40 mg total) by mouth daily.    Dispense:  100 tablet    Refill:  1    Please send a replace/new response with 100-Day Supply if appropriate to maximize member benefit. Requesting 1 year supply.    Supervising Provider:   DOMENICA BLACKBIRD A [[id number]]   amLODipine  (NORVASC ) 10 MG tablet    Sig: Take 1 tablet (10 mg total) by mouth daily.    Dispense:  100 tablet  Refill:  1    Please send a replace/new response with 100-Day Supply if appropriate to maximize member benefit. Requesting 1 year supply.    Supervising Provider:   DOMENICA BLACKBIRD A [phone]

## 2024-03-22 ENCOUNTER — Ambulatory Visit: Payer: Self-pay | Admitting: Family

## 2024-03-22 DIAGNOSIS — R972 Elevated prostate specific antigen [PSA]: Secondary | ICD-10-CM

## 2024-03-22 NOTE — Telephone Encounter (Signed)
 Please advise pt that PSA is mildly elevated. Could be due to prostate enlargement, but I would like for him to meet with Urology to discuss. Referral placed.

## 2024-03-30 ENCOUNTER — Other Ambulatory Visit: Payer: Self-pay | Admitting: Family

## 2024-03-30 DIAGNOSIS — R251 Tremor, unspecified: Secondary | ICD-10-CM

## 2024-04-25 ENCOUNTER — Encounter: Payer: Self-pay | Admitting: Urology

## 2024-04-25 ENCOUNTER — Ambulatory Visit: Admitting: Urology

## 2024-04-25 VITALS — BP 164/86 | HR 73 | Ht 67.0 in | Wt 190.0 lb

## 2024-04-25 DIAGNOSIS — R972 Elevated prostate specific antigen [PSA]: Secondary | ICD-10-CM | POA: Insufficient documentation

## 2024-04-25 LAB — URINALYSIS, ROUTINE W REFLEX MICROSCOPIC
Bilirubin, UA: NEGATIVE
Glucose, UA: NEGATIVE
Ketones, UA: NEGATIVE
Leukocytes,UA: NEGATIVE
Nitrite, UA: NEGATIVE
Protein,UA: NEGATIVE
RBC, UA: NEGATIVE
Specific Gravity, UA: 1.015 (ref 1.005–1.030)
Urobilinogen, Ur: 0.2 mg/dL (ref 0.2–1.0)
pH, UA: 7 (ref 5.0–7.5)

## 2024-04-25 NOTE — Progress Notes (Signed)
 Assessment: 1. Elevated PSA     Plan: I personally reviewed the patient's chart including provider notes, lab results. Today I had a long discussion with the patient regarding PSA and the rationale and controversies of prostate cancer early detection.  I discussed the pros and cons of further evaluation including TRUS and prostate Bx.  Potential adverse events and complications as well as standard instructions were given.  Patient expressed his understanding of these issues. I also discussed the role of prostate MRI and evaluation of elevated PSA and possible prostate cancer. PHI today - will call with results  Chief Complaint:  Chief Complaint  Patient presents with   Elevated PSA    History of Present Illness:  Leroy Horton is a 78 y.o. male who is seen in consultation from Daryl Setter, NP for evaluation of elevated PSA. PSA results: 12/23 3.02 7/25 4.83  No prior history of elevated PSA.  No history of UTIs or prostatitis.  No family history of prostate cancer. He does not have significant lower urinary tract symptoms.  He reports nocturia 0-1 time per night.  No dysuria or gross hematuria.  Past Medical History:  Past Medical History:  Diagnosis Date   Acid reflux    Allergy    Anemia    Arthritis    Blood transfusion without reported diagnosis    Cancer (HCC)    skin (basal cell multiple) followed by [facility].   Cataract    Duodenal ulcer    Glaucoma    H/O agent Orange exposure    Heart murmur    Hiatal hernia - 10 cm w/ Ole erosions 06/28/2017   High cholesterol    Hypertension    Multiple duodenal ulcers     Past Surgical History:  Past Surgical History:  Procedure Laterality Date   APPENDECTOMY     CAPSULOTOMY     CATARACT EXTRACTION Bilateral 2013   ESOPHAGOGASTRODUODENOSCOPY N/A 06/28/2017   Procedure: ESOPHAGOGASTRODUODENOSCOPY (EGD);  Surgeon: Avram Lupita BRAVO, MD;  Location: [facility] ENDOSCOPY;  Service: Endoscopy;   Laterality: N/A;   HAND SURGERY Right 2015   duputrens contracture   ROTATOR CUFF REPAIR Left 2005    Allergies:  Allergies  Allergen Reactions   Lisinopril Cough   Sulfa Antibiotics    Sulfamethoxazole Rash    Family History:  Family History  Problem Relation Age of Onset   Hypertension Mother    Ulcers Mother    Hypertension Father    Ulcers Father    Hiatal hernia Sister    Aneurysm Brother        brain   Cancer Neg Hx     Social History:  Social History   Tobacco Use   Smoking status: Former    Current packs/day: 0.00    Types: Cigarettes, Cigars    Quit date: 1980    Years since quitting: 45.6   Smokeless tobacco: Never  Substance Use Topics   Alcohol use: Yes    Comment: 3-4 x a week will have 3-4 drinks   Drug use: Not Currently    Types: Marijuana    Review of symptoms:  Constitutional:  Negative for unexplained weight loss, night sweats, fever, chills ENT:  Negative for nose bleeds, sinus pain, painful swallowing CV:  Negative for chest pain, shortness of breath, exercise intolerance, palpitations, loss of consciousness Resp:  Negative for cough, wheezing, shortness of breath GI:  Negative for nausea, vomiting, diarrhea, bloody stools GU:  Positives noted in HPI; otherwise negative  for gross hematuria, dysuria, urinary incontinence Neuro:  Negative for seizures, poor balance, limb weakness, slurred speech Psych:  Negative for lack of energy, depression, anxiety Endocrine:  Negative for polydipsia, polyuria, symptoms of hypoglycemia (dizziness, hunger, sweating) Hematologic:  Negative for anemia, purpura, petechia, prolonged or excessive bleeding, use of anticoagulants  Allergic:  Negative for difficulty breathing or choking as a result of exposure to anything; no shellfish allergy; no allergic response (rash/itch) to materials, foods  Physical exam: BP (!) 164/86   Pulse 73   Ht 5' 7 (1.702 m)   Wt 190 lb (86.2 kg)   BMI 29.76 kg/m  GENERAL  APPEARANCE:  Well appearing, well developed, well nourished, NAD HEENT: Atraumatic, Normocephalic, oropharynx clear. NECK: Supple without lymphadenopathy or thyromegaly. LUNGS: Clear to auscultation bilaterally. HEART: Regular Rate and Rhythm without murmurs, gallops, or rubs. ABDOMEN: Soft, non-tender, No Masses. EXTREMITIES: Moves all extremities well.  Without clubbing, cyanosis, or edema. NEUROLOGIC:  Alert and oriented x 3, normal gait, CN II-XII grossly intact.  MENTAL STATUS:  Appropriate. BACK:  Non-tender to palpation.  No CVAT SKIN:  Warm, dry and intact.   GU: Penis:  uncircumcised Meatus: Normal Scrotum: normal, no masses Testis: normal without masses bilateral Prostate: 40 g, nontender, no nodules Rectum: Normal tone,  no masses or tenderness   Results: U/A: Negative

## 2024-04-26 ENCOUNTER — Ambulatory Visit

## 2024-04-27 LAB — REFLEX INFORMATION

## 2024-04-27 LAB — PROSTATE HEALTH INDEX: Prostate Specific Ag: 2 ng/mL (ref 0.0–3.9)

## 2024-04-29 ENCOUNTER — Ambulatory Visit: Payer: Self-pay | Admitting: Urology

## 2024-05-18 ENCOUNTER — Ambulatory Visit: Admitting: *Deleted

## 2024-05-18 VITALS — Ht 67.0 in | Wt 190.0 lb

## 2024-05-18 DIAGNOSIS — Z0000 Encounter for general adult medical examination without abnormal findings: Secondary | ICD-10-CM

## 2024-05-18 NOTE — Patient Instructions (Addendum)
 Leroy Horton , Thank you for taking time out of your busy schedule to complete your Annual Wellness Visit with me. I enjoyed our conversation and look forward to speaking with you again next year. I, as well as your care team,  appreciate your ongoing commitment to your health goals. Please review the following plan we discussed and let me know if I can assist you in the future. Your Game plan/ To Do List    Send us  a mychart message when you are ready for your COVID vaccine with your pharmacy preference and we will send in the prescription.  Follow up Visits: Next Medicare AWV with our clinical staff:  05/24/25 1pm, telephone.  Next Office Visit with your provider: 09/21/24 8:40am, Eleanor Ponto, NP  Clinician Recommendations:  Aim for 30 minutes of exercise or brisk walking, 6-8 glasses of water, and 5 servings of fruits and vegetables each day.       This is a list of the screening recommended for you and due dates:  Health Maintenance  Topic Date Due   Flu Shot  04/06/2024   COVID-19 Vaccine (7 - Moderna risk 2024-25 season) 05/07/2024   Medicare Annual Wellness Visit  05/17/2024   DTaP/Tdap/Td vaccine (4 - Td or Tdap) 03/23/2032   Pneumococcal Vaccine for age over 82  Completed   Zoster (Shingles) Vaccine  Completed   HPV Vaccine  Aged Out   Meningitis B Vaccine  Aged Out   Hepatitis C Screening  Discontinued   Cologuard (Stool DNA test)  Discontinued    Advanced directives: (Copy Requested) Please bring a copy of your health care power of attorney and living will to the office to be added to your chart at your convenience. You can mail to [facility] [address] [place], [place] [address] or email to [email or web address] Advance Care Planning is important because it:  [x]  Makes sure you receive the medical care that is consistent with your values, goals, and preferences  [x]  It provides guidance to your family and loved ones and reduces their  decisional burden about whether or not they are making the right decisions based on your wishes.  Follow the link provided in your after visit summary or read over the paperwork we have mailed to you to help you started getting your Advance Directives in place. If you need assistance in completing these, please reach out to us  so that we can help you!  See attachments for Preventive Care and Fall Prevention Tips.

## 2024-05-18 NOTE — Progress Notes (Addendum)
 Subjective:   Leroy Horton is a 78 y.o. who presents for a Medicare Wellness preventive visit.  As a reminder, Annual Wellness Visits don't include a physical exam, and some assessments may be limited, especially if this visit is performed virtually. We may recommend an in-person follow-up visit with your provider if needed.  Visit Complete: Virtual I connected with  Leroy Horton on 05/18/24 by a audio enabled telemedicine application and verified that I am speaking with the correct person using two identifiers.  Patient Location: Home  Provider Location: Office/Clinic  I discussed the limitations of evaluation and management by telemedicine. The patient expressed understanding and agreed to proceed.  Vital Signs: Because this visit was a virtual/telehealth visit, some criteria may be missing or patient reported. Any vitals not documented were not able to be obtained and vitals that have been documented are patient reported.  VideoDeclined- This patient declined Librarian, academic. Therefore the visit was completed with audio only.  Persons Participating in Visit: Patient.  AWV Questionnaire: Yes: Patient Medicare AWV questionnaire was completed by the patient on 05/11/24; I have confirmed that all information answered by patient is correct and no changes since this date.  Cardiac Risk Factors include: advanced age (>58men, >104 women);male gender;hypertension;dyslipidemia     Objective:    Today's Vitals   05/18/24 1303  Weight: 190 lb (86.2 kg)  Height: 5' 7 (1.702 m)   Body mass index is 29.76 kg/m.     05/18/2024    1:12 PM 05/18/2023    9:03 AM 06/28/2017   12:02 AM 06/27/2017    5:32 PM  Advanced Directives  Does Patient Have a Medical Advance Directive? Yes Yes No  No   Type of Estate agent of [place];Living will Healthcare Power of [place];Living will    Does patient want to make changes to medical advance  directive? No - Patient declined No - Patient declined    Copy of Healthcare Power of Attorney in Chart? No - copy requested     Would patient like information on creating a medical advance directive?   No - Patient declined       Data saved with a previous flowsheet row definition    Current Medications (verified) Outpatient Encounter Medications as of 05/18/2024  Medication Sig   amLODipine  (NORVASC ) 10 MG tablet Take 1 tablet (10 mg total) by mouth daily.   CALCIUM MAGNESIUM ZINC PO Take by mouth.   Cholecalciferol (VITAMIN D3) 50 MCG (2000 UT) capsule Take 2,000 Units by mouth daily.   Coenzyme Q10 (COQ-10) 200 MG CAPS Take 200 mcg by mouth daily.   famotidine  (PEPCID ) 40 MG tablet Take 1 tablet (40 mg total) by mouth daily.   fluticasone  (FLONASE ) 50 MCG/ACT nasal spray Place 2 sprays into both nostrils daily as needed for allergies or rhinitis.   losartan  (COZAAR ) 100 MG tablet Take 1 tablet (100 mg total) by mouth daily.   Multiple Vitamin (MULTIVITAMIN WITH MINERALS) TABS tablet Take 1 tablet by mouth daily.   pantoprazole  (PROTONIX ) 40 MG tablet Take 1 tablet (40 mg total) by mouth daily before breakfast.   pravastatin  (PRAVACHOL ) 20 MG tablet Take 1 tablet (20 mg total) by mouth daily.   propranolol  (INDERAL ) 20 MG tablet TAKE 1 TABLET BY MOUTH TWICE  DAILY   vitamin B-12 (CYANOCOBALAMIN) 500 MCG tablet Take 500 mcg by mouth daily.   No facility-administered encounter medications on file as of 05/18/2024.    Allergies (verified)  Lisinopril, Sulfa antibiotics, and Sulfamethoxazole   History: Past Medical History:  Diagnosis Date   Acid reflux    Allergy    Anemia    Arthritis    Blood transfusion without reported diagnosis    Cancer (HCC)    skin (basal cell multiple) followed by [facility].   Cataract    Duodenal ulcer    Glaucoma    H/O agent Orange exposure    Heart murmur    Hiatal hernia - 10 cm w/ Ole erosions 06/28/2017   High cholesterol     Hypertension    Multiple duodenal ulcers    Past Surgical History:  Procedure Laterality Date   APPENDECTOMY     CAPSULOTOMY     CATARACT EXTRACTION Bilateral 2013   ESOPHAGOGASTRODUODENOSCOPY N/A 06/28/2017   Procedure: ESOPHAGOGASTRODUODENOSCOPY (EGD);  Surgeon: Avram Lupita BRAVO, MD;  Location: [facility] ENDOSCOPY;  Service: Endoscopy;  Laterality: N/A;   HAND SURGERY Right 2015   duputrens contracture   ROTATOR CUFF REPAIR Left 2005   Family History  Problem Relation Age of Onset   Hypertension Mother    Ulcers Mother    Hypertension Father    Ulcers Father    Hiatal hernia Sister    Aneurysm Brother        brain   Cancer Neg Hx    Social History   Socioeconomic History   Marital status: Married    Spouse name: Avelina   Number of children: Not on file   Years of education: Not on file   Highest education level: Associate degree: occupational, Scientist, product/process development, or vocational program  Occupational History   Occupation: retired    Comment: Estate agent  Tobacco Use   Smoking status: Former    Current packs/day: 0.00    Types: Cigarettes, Cigars    Quit date: 1980    Years since quitting: 45.7   Smokeless tobacco: Never  Substance and Sexual Activity   Alcohol use: Yes    Comment: 3-4 x a week will have 3-4 drinks   Drug use: Not Currently    Types: Marijuana   Sexual activity: Yes    Partners: Female    Birth control/protection: None  Other Topics Concern   Not on file  Social History Narrative   Retired from Estate agent (Cabin crew)   Wife, Programmer, systems, 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)   Completed trade school (2 years)   Enjoys Diplomatic Services operational officer, yard work   2 sons (both local)   1 grand daughter   No pets    Social Drivers of Corporate investment banker Strain: Low Risk  (05/18/2024)   Overall Financial Resource Strain (CARDIA)    Difficulty of Paying Living Expenses: Not very hard  Food Insecurity: No Food Insecurity  (05/18/2024)   Hunger Vital Sign    Worried About Running Out of Food in the Last Year: Never true    Ran Out of Food in the Last Year: Never true  Transportation Needs: No Transportation Needs (05/18/2024)   PRAPARE - Administrator, Civil Service (Medical): No    Lack of Transportation (Non-Medical): No  Physical Activity: Sufficiently Active (05/18/2024)   Exercise Vital Sign    Days of Exercise per Week: 5 days    Minutes of Exercise per Session: 50 min  Stress: No Stress Concern Present (05/18/2024)   [employer] of Occupational Health - Occupational Stress Questionnaire    Feeling of Stress: Not at  all  Social Connections: Moderately Integrated (05/18/2024)   Social Connection and Isolation Panel    Frequency of Communication with Friends and Family: More than three times a week    Frequency of Social Gatherings with Friends and Family: Twice a week    Attends Religious Services: More than 4 times per year    Active Member of [employer] or Organizations: No    Attends Engineer, structural: Never    Marital Status: Married    Tobacco Counseling Counseling given: Not Answered    Clinical Intake:  Pre-visit preparation completed: Yes  Pain : No/denies pain     BMI - recorded: 29.76 Nutritional Status: BMI 25 -29 Overweight Nutritional Risks: None  Lab Results  Component Value Date   HGBA1C 5.7 08/18/2023     How often do you need to have someone help you when you read instructions, pamphlets, or other written materials from your doctor or pharmacy?: 1 - Never What is the last grade level you completed in school?: associate's degree  Interpreter Needed?: No  Information entered by :: Lolita Libra, CMA(AAMA)   Activities of Daily Living     05/11/2024    1:18 PM  In your present state of health, do you have any difficulty performing the following activities:  Hearing? 0  Vision? 0  Difficulty concentrating or making decisions? 0   Walking or climbing stairs? 0  Dressing or bathing? 0  Doing errands, shopping? 0  Preparing Food and eating ? N  Using the Toilet? N  In the past six months, have you accidently leaked urine? N  Do you have problems with loss of bowel control? N  Managing your Medications? N  Managing your Finances? N  Housekeeping or managing your Housekeeping? N    Patient Care Team: O'Sullivan, Melissa, NP as PCP - General (Internal Medicine) Clinic, [facility]  I have updated your Care Teams any recent Medical Services you may have received from other providers in the past year.     Assessment:   This is a routine wellness examination for Makayla.  Hearing/Vision screen Hearing Screening - Comments:: Denies hearing difficulties.  Vision Screening - Comments:: Up to date with the [place] in [place]. Goes twice a year for double vision.   Goals Addressed   None    Depression Screen     05/18/2024    1:11 PM 05/18/2023    9:05 AM 08/11/2022    8:07 AM  PHQ 2/9 Scores  PHQ - 2 Score 0 0 0  PHQ- 9 Score 2      Fall Risk     05/11/2024    1:18 PM 05/11/2023    2:59 PM 11/17/2022   12:40 PM 11/17/2022    6:07 AM 08/11/2022    8:07 AM  Fall Risk   Falls in the past year? 1 0 0 0 0  Comment missed bottom rung of step ladder      Number falls in past yr: 0 0   0  Injury with Fall? 0 0   0  Risk for fall due to :  No Fall Risks     Follow up Education provided Falls evaluation completed   Falls evaluation completed      Data saved with a previous flowsheet row definition    MEDICARE RISK AT HOME:  Medicare Risk at Home Any stairs in or around the home?: (Patient-Rptd) No If so, are there any without handrails?: (Patient-Rptd) No Home free of loose  throw rugs in walkways, pet beds, electrical cords, etc?: (Patient-Rptd) Yes Adequate lighting in your home to reduce risk of falls?: (Patient-Rptd) Yes Life alert?: (Patient-Rptd) No Use of a cane, walker or w/c?: (Patient-Rptd)  No Grab bars in the bathroom?: (Patient-Rptd) No Shower chair or bench in shower?: (Patient-Rptd) No Elevated toilet seat or a handicapped toilet?: (Patient-Rptd) No  TIMED UP AND GO:  Was the test performed?  No, audio  Cognitive Function: 6CIT completed        05/18/2024    1:12 PM 05/18/2023    9:06 AM  6CIT Screen  What Year? 0 points 0 points  What month? 0 points 0 points  What time? 0 points 0 points  Count back from 20 0 points 0 points  Months in reverse 0 points 0 points  Repeat phrase 0 points 0 points  Total Score 0 points 0 points    Immunizations Immunization History  Administered Date(s) Administered   Fluad Quad(high Dose 65+) 06/02/2021   Influenza-Unspecified 06/06/2014, 06/06/2022, 05/08/2023   Moderna Covid-19 Fall Seasonal Vaccine 56yrs & older 06/02/2023   Moderna Covid-19 Vaccine Bivalent Booster 40yrs & up 06/17/2021   Moderna Sars-Covid-2 Vaccination 10/06/2019, 11/05/2019, 07/07/2020, 03/07/2021   Pfizer(Comirnaty)Fall Seasonal Vaccine 12 years and older 05/08/2023   Pneumococcal Conjugate-13 06/06/2014, 05/08/2015   Pneumococcal Polysaccharide-23 06/27/2015   Td (Adult),5 Lf Tetanus Toxid, Preservative Free 03/23/2022   Tdap 05/14/2011, 06/11/2012   Zoster Recombinant(Shingrix) 04/01/2020, 06/02/2020    Screening Tests Health Maintenance  Topic Date Due   Influenza Vaccine  04/06/2024   COVID-19 Vaccine (7 - Moderna risk 2024-25 season) 05/07/2024   Medicare Annual Wellness (AWV)  05/18/2025   DTaP/Tdap/Td (4 - Td or Tdap) 03/23/2032   Pneumococcal Vaccine: 50+ Years  Completed   Zoster Vaccines- Shingrix  Completed   HPV VACCINES  Aged Out   Meningococcal B Vaccine  Aged Out   Hepatitis C Screening  Discontinued   Fecal DNA (Cologuard)  Discontinued    Health Maintenance Items Addressed: Will get flu vaccine in the fall.  Will call for COVID Rx when he decides to complete that.  Additional Screening:  Vision Screening:  Recommended annual ophthalmology exams for early detection of glaucoma and other disorders of the eye. Is the patient up to date with their annual eye exam?  Yes  Who is the provider or what is the name of the office in which the patient attends annual eye exams? [facility]  Dental Screening: Recommended annual dental exams for proper oral hygiene  Community Resource Referral / Chronic Care Management: CRR required this visit?  No   CCM required this visit?  No   Plan:    I have personally reviewed and noted the following in the patient's chart:   Medical and social history Use of alcohol, tobacco or illicit drugs  Current medications and supplements including opioid prescriptions. Patient is not currently taking opioid prescriptions. Functional ability and status Nutritional status Physical activity Advanced directives List of other physicians Hospitalizations, surgeries, and ER visits in previous 12 months Vitals Screenings to include cognitive, depression, and falls Referrals and appointments  In addition, I have reviewed and discussed with patient certain preventive protocols, quality metrics, and best practice recommendations. A written personalized care plan for preventive services as well as general preventive health recommendations were provided to patient.   Lolita Libra, CMA   05/18/2024   After Visit Summary: (MyChart) Due to this being a telephonic visit, the after visit  summary with patients personalized plan was offered to patient via MyChart   Notes: Nothing significant to report at this time.

## 2024-06-06 ENCOUNTER — Other Ambulatory Visit (HOSPITAL_BASED_OUTPATIENT_CLINIC_OR_DEPARTMENT_OTHER): Payer: Self-pay

## 2024-06-06 MED ORDER — FLUZONE HIGH-DOSE 0.5 ML IM SUSY
0.5000 mL | PREFILLED_SYRINGE | Freq: Once | INTRAMUSCULAR | 0 refills | Status: AC
Start: 2024-06-06 — End: 2024-06-07
  Filled 2024-06-06: qty 0.5, 1d supply, fill #0

## 2024-06-06 MED ORDER — COMIRNATY 30 MCG/0.3ML IM SUSY
0.3000 mL | PREFILLED_SYRINGE | Freq: Once | INTRAMUSCULAR | 0 refills | Status: AC
  Filled 2024-06-06: qty 0.3, 1d supply, fill #0

## 2024-09-21 ENCOUNTER — Ambulatory Visit: Payer: Self-pay | Admitting: Family

## 2024-09-21 ENCOUNTER — Ambulatory Visit: Admitting: Family

## 2024-09-21 VITALS — BP 128/80 | HR 61 | Temp 97.6°F | Resp 16 | Ht 67.0 in | Wt 190.0 lb

## 2024-09-21 DIAGNOSIS — R972 Elevated prostate specific antigen [PSA]: Secondary | ICD-10-CM | POA: Diagnosis not present

## 2024-09-21 DIAGNOSIS — C4490 Unspecified malignant neoplasm of skin, unspecified: Secondary | ICD-10-CM | POA: Diagnosis not present

## 2024-09-21 DIAGNOSIS — J309 Allergic rhinitis, unspecified: Secondary | ICD-10-CM | POA: Diagnosis not present

## 2024-09-21 DIAGNOSIS — K219 Gastro-esophageal reflux disease without esophagitis: Secondary | ICD-10-CM

## 2024-09-21 DIAGNOSIS — I10 Essential (primary) hypertension: Secondary | ICD-10-CM | POA: Diagnosis not present

## 2024-09-21 DIAGNOSIS — R251 Tremor, unspecified: Secondary | ICD-10-CM | POA: Diagnosis not present

## 2024-09-21 DIAGNOSIS — E7800 Pure hypercholesterolemia, unspecified: Secondary | ICD-10-CM

## 2024-09-21 LAB — COMPREHENSIVE METABOLIC PANEL WITH GFR
ALT: 27 U/L (ref 3–53)
AST: 22 U/L (ref 5–37)
Albumin: 4.6 g/dL (ref 3.5–5.2)
Alkaline Phosphatase: 47 U/L (ref 39–117)
BUN: 12 mg/dL (ref 6–23)
CO2: 31 meq/L (ref 19–32)
Calcium: 9.7 mg/dL (ref 8.4–10.5)
Chloride: 104 meq/L (ref 96–112)
Creatinine, Ser: 0.76 mg/dL (ref 0.40–1.50)
GFR: 87.45 mL/min
Glucose, Bld: 106 mg/dL — ABNORMAL HIGH (ref 70–99)
Potassium: 4.8 meq/L (ref 3.5–5.1)
Sodium: 141 meq/L (ref 135–145)
Total Bilirubin: 0.7 mg/dL (ref 0.2–1.2)
Total Protein: 7.1 g/dL (ref 6.0–8.3)

## 2024-09-21 LAB — LIPID PANEL
Cholesterol: 165 mg/dL (ref 28–200)
HDL: 42.8 mg/dL
LDL Cholesterol: 104 mg/dL — ABNORMAL HIGH (ref 10–99)
NonHDL: 122.48
Total CHOL/HDL Ratio: 4
Triglycerides: 94 mg/dL (ref 10.0–149.0)
VLDL: 18.8 mg/dL (ref 0.0–40.0)

## 2024-09-21 MED ORDER — FLUTICASONE PROPIONATE 50 MCG/ACT NA SUSP: 2.0000 | Freq: Every day | NASAL | 5 refills | Status: AC | PRN

## 2024-09-21 MED ORDER — FAMOTIDINE 40 MG PO TABS: 40.0000 mg | ORAL_TABLET | Freq: Every day | ORAL | 1 refills | Status: AC

## 2024-09-21 MED ORDER — AMLODIPINE BESYLATE 10 MG PO TABS: 10.0000 mg | ORAL_TABLET | Freq: Every day | ORAL | 1 refills | Status: AC

## 2024-09-21 MED ORDER — PANTOPRAZOLE SODIUM 40 MG PO TBEC: 40.0000 mg | DELAYED_RELEASE_TABLET | Freq: Every day | ORAL | 2 refills | Status: AC

## 2024-09-21 MED ORDER — PRAVASTATIN SODIUM 20 MG PO TABS: 20.0000 mg | ORAL_TABLET | Freq: Every day | ORAL | 1 refills | Status: AC

## 2024-09-21 NOTE — Assessment & Plan Note (Signed)
 Lab Results  Component Value Date   CHOL 174 08/16/2023   HDL 43.00 08/16/2023   LDLCALC 86 08/16/2023   TRIG 228.0 (H) 08/16/2023   CHOLHDL 4 08/16/2023  The 10-year ASCVD risk score (Arnett DK, et al., 2019) is: 30.2%   Values used to calculate the score:     Age: 79 years     Clinically relevant sex: Male     Is Non-Hispanic African American: No     Diabetic: No     Tobacco smoker: No     Systolic Blood Pressure: 128 mmHg     Is BP treated: Yes     HDL Cholesterol: 43 mg/dL     Total Cholesterol: 174 mg/dL  Continue pravastatin .

## 2024-09-21 NOTE — Assessment & Plan Note (Signed)
 Sees dermatology regularly.  Has hx of squamous cell carcinoma and basal cell carcinomal.

## 2024-09-21 NOTE — Progress Notes (Signed)
" ° °  Established Patient Office Visit  Subjective   Patient ID: Leroy Horton, male    DOB: 1946/06/08  Age: 79 y.o. MRN: [id number]  Chief Complaint  Patient presents with   Hypertension    Here for follow up   Hyperlipidemia    Here for follow up   Hypertension  Hyperlipidemia   Patient in office today for blood pressure check. Initial reading 148/78. On recheck, BP 128/80. Patient reports this is typical for him sometimes its high and other times its normal. Reports compliance with mediation regime, and believes blood pressure is difficult to control due to exposure to agent orange in the [place]. Patient reports engaging in daily exercise that includes 2 miles of walking in the morning. Review of Systems  Constitutional: Negative.   HENT: Negative.    Eyes: Negative.   Respiratory: Negative.    Cardiovascular: Negative.   Gastrointestinal: Negative.   Genitourinary: Negative.   Musculoskeletal: Negative.   Skin: Negative.   Neurological: Negative.   Endo/Heme/Allergies: Negative.   Psychiatric/Behavioral: Negative.        Objective:     BP 128/80   Pulse 61   Temp 97.6 F (36.4 C) (Oral)   Resp 16   Ht 5' 7 (1.702 m)   Wt 190 lb (86.2 kg)   SpO2 99%   BMI 29.76 kg/m   Physical Exam Vitals reviewed.  Constitutional:      Appearance: Normal appearance.  HENT:     Head: Normocephalic.  Cardiovascular:     Rate and Rhythm: Normal rate and regular rhythm.     Heart sounds: Normal heart sounds.  Pulmonary:     Effort: Pulmonary effort is normal.     Breath sounds: Normal breath sounds.  Musculoskeletal:        General: Normal range of motion.     Cervical back: Normal range of motion.     Comments: Resting tremor managed well with use of propanolol  Neurological:     Mental Status: He is alert and oriented to person, place, and time.  Psychiatric:        Mood and Affect: Mood normal.        Behavior: Behavior normal.    Last lipids Lab Results   Component Value Date   CHOL 174 08/16/2023   HDL 43.00 08/16/2023   LDLCALC 86 08/16/2023   TRIG 228.0 (H) 08/16/2023   CHOLHDL 4 08/16/2023    The 10-year ASCVD risk score (Arnett DK, et al., 2019) is: 30.2% -Patient aware and compliant with Pravastatin      Assessment & Plan:  -Hypertension- continue medication regime. Patient instructed to take blood pressure before coming in for next visit. Patient instructed to take blood pressures at home and report any consistently high readings greater than or equal to 140/90  -Hyperlipidemia-continue pravastatin , Lipid panel today   CMET    Refills sent   Follow up in 6 months  Levon Layth Cerezo-FNP student   "

## 2024-09-21 NOTE — Assessment & Plan Note (Signed)
 BP Readings from Last 3 Encounters:  09/21/24 128/80  04/25/24 (!) 164/86  03/21/24 138/75   BP OK on second check today. Continue amlodipine , losartan  and propranolol .

## 2024-09-21 NOTE — Assessment & Plan Note (Signed)
 Stable on pepcid  bid. Continue same.

## 2024-09-21 NOTE — Progress Notes (Signed)
 "  Subjective:     Patient ID: Leroy Horton, male    DOB: 1945/11/25, 79 y.o.   MRN: [id number]  Chief Complaint  Patient presents with   Hypertension    Here for follow up   Hyperlipidemia    Here for follow up    Hypertension  Hyperlipidemia    Discussed the use of AI scribe software for clinical note transcription with the patient, who gave verbal consent to proceed.  History of Present Illness Leroy Horton is a 79 year old male with hypertension who presents for a follow-up on blood pressure management.  His initial blood pressure reading was 148/normal, but a subsequent reading was 128/80, which is typical for him. He is compliant with his medications, including losartan  and amlodipine , but sometimes forgets to take propranolol  in the afternoon, which he uses for tremors rather than blood pressure control. He took his blood pressure medication at 5:10 AM today. No symptoms or issues related to blood pressure were reported.  He has a history of elevated PSA levels and is under the care of a urologist. A recent test showed a level of 2 point something, and he is scheduled for a follow-up in six months. No biopsy has been performed.  He has a history of skin cancer, specifically basal cell and squamous cell carcinoma, and attends dermatology appointments every six months. He had a lesion on his head, but the type was not specified.  He experiences tremors, which are managed with propranolol . The tremors have improved, allowing him to drive more comfortably, although he still experiences some tremor.  He has double vision, which is managed with prism  glasses that alleviate the symptoms. [facility], suggesting it might be a lifelong condition.  He takes Flonase  year-round for allergies and no longer requires antihistamines like Claritin. He also takes Pepcid  and pantoprazole  for GERD, noting that Pepcid  is less effective, but pantoprazole  helps with  nighttime symptoms.  He walks about two miles every morning, although he did not walk today due to cold weather.      There are no preventive care reminders to display for this patient.  Past Medical History:  Diagnosis Date   Acid reflux    Allergy    Anemia    Arthritis    Blood transfusion without reported diagnosis    Cancer (HCC)    skin (basal cell multiple) followed by [facility].   Cataract    Drusen (degenerative) of macula, right eye 2025   Duodenal ulcer    Glaucoma    H/O agent Orange exposure    Heart murmur    Hiatal hernia - 10 cm w/ Ole erosions 06/28/2017   High cholesterol    Hypertension    Multiple duodenal ulcers     Past Surgical History:  Procedure Laterality Date   APPENDECTOMY     CAPSULOTOMY     CATARACT EXTRACTION Bilateral 2013   ESOPHAGOGASTRODUODENOSCOPY N/A 06/28/2017   Procedure: ESOPHAGOGASTRODUODENOSCOPY (EGD);  Surgeon: Avram Lupita BRAVO, MD;  [facility] END[facility]COPY;  Service: Endoscopy;  Laterality: N/A;   HAND SURGERY Right 2015   duputrens contracture   ROTATOR CUFF REPAIR Left 2005    Family History  Problem Relation Age of Onset   Hypertension Mother    Ulcers Mother    Hypertension Father    Ulcers Father    Hiatal hernia Sister    Aneurysm Brother        brain  Cancer Neg Hx     Social History   Socioeconomic History   Marital status: Married    Spouse name: Avelina   Number of children: Not on file   Years of education: Not on file   Highest education level: Associate degree: occupational, scientist, product/process development, or vocational program  Occupational History   Occupation: retired    Comment: Estate Agent  Tobacco Use   Smoking status: Former    Current packs/day: 0.00    Types: Cigarettes, Cigars    Quit date: 1980    Years since quitting: 46.0   Smokeless tobacco: Never  Substance and Sexual Activity   Alcohol use: Yes    Comment: 3-4 x a week will have 3-4 drinks   Drug use: Not Currently    Types:  Marijuana   Sexual activity: Yes    Partners: Female    Birth control/protection: None  Other Topics Concern   Not on file  Social History Narrative   Retired from Estate Agent (cabin crew)   Wife, Programmer, Systems, teaches 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 ([place] vet)   Completed trade school (2 years)   Enjoys diplomatic services operational officer, yard work   2 sons (both local)   1 grand daughter   No pets    Social Drivers of Health   Tobacco Use: Medium Risk (05/18/2024)   Patient History    Smoking Tobacco Use: Former    Smokeless Tobacco Use: Never    Passive Exposure: Not on Actuary Strain: Low Risk (09/14/2024)   Overall Financial Resource Strain (CARDIA)    Difficulty of Paying Living Expenses: Not very hard  Food Insecurity: No Food Insecurity (09/14/2024)   Epic    Worried About Programme Researcher, Broadcasting/film/video in the Last Year: Never true    Ran Out of Food in the Last Year: Never true  Transportation Needs: No Transportation Needs (09/14/2024)   Epic    Lack of Transportation (Medical): No    Lack of Transportation (Non-Medical): No  Physical Activity: Sufficiently Active (09/14/2024)   Exercise Vital Sign    Days of Exercise per Week: 5 days    Minutes of Exercise per Session: 50 min  Stress: Stress Concern Present (09/14/2024)   [employer] of Occupational Health - Occupational Stress Questionnaire    Feeling of Stress: To some extent  Social Connections: Moderately Integrated (09/14/2024)   Social Connection and Isolation Panel    Frequency of Communication with Friends and Family: More than three times a week    Frequency of Social Gatherings with Friends and Family: Once a week    Attends Religious Services: More than 4 times per year    Active Member of [employer] or Organizations: No    Attends Banker Meetings: Not on file    Marital Status: Married  Intimate Partner Violence: Not At Risk (05/18/2024)   Epic    Fear of Current or Ex-Partner: No     Emotionally Abused: No    Physically Abused: No    Sexually Abused: No  Depression (PHQ2-9): Low Risk (05/18/2024)   Depression (PHQ2-9)    PHQ-2 Score: 2  Alcohol Screen: Low Risk (09/14/2024)   Alcohol Screen    Last Alcohol Screening Score (AUDIT): 5  Housing: Unknown (09/14/2024)   Epic    Unable to Pay for Housing in the Last Year: No    Number of Times Moved in the Last Year: Not on file    Homeless in the  Last Year: No  Utilities: Not At Risk (05/18/2024)   Epic    Threatened with loss of utilities: No  Health Literacy: Adequate Health Literacy (05/18/2024)   B1300 Health Literacy    Frequency of need for help with medical instructions: Never    Outpatient Medications Prior to Visit  Medication Sig Dispense Refill   CALCIUM MAGNESIUM ZINC PO Take by mouth.     Cholecalciferol (VITAMIN D3) 50 MCG (2000 UT) capsule Take 2,000 Units by mouth daily.     Coenzyme Q10 (COQ-10) 200 MG CAPS Take 200 mcg by mouth daily.     losartan  (COZAAR ) 100 MG tablet Take 1 tablet (100 mg total) by mouth daily. 100 tablet 1   Multiple Vitamin (MULTIVITAMIN WITH MINERALS) TABS tablet Take 1 tablet by mouth daily.     propranolol  (INDERAL ) 20 MG tablet TAKE 1 TABLET BY MOUTH TWICE  DAILY 200 tablet 2   vitamin B-12 (CYANOCOBALAMIN) 500 MCG tablet Take 500 mcg by mouth daily.     amLODipine  (NORVASC ) 10 MG tablet Take 1 tablet (10 mg total) by mouth daily. 100 tablet 1   famotidine  (PEPCID ) 40 MG tablet Take 1 tablet (40 mg total) by mouth daily. 100 tablet 1   fluticasone  (FLONASE ) 50 MCG/ACT nasal spray Place 2 sprays into both nostrils daily as needed for allergies or rhinitis. 48 g 5   pantoprazole  (PROTONIX ) 40 MG tablet Take 1 tablet (40 mg total) by mouth daily before breakfast. 100 tablet 2   pravastatin  (PRAVACHOL ) 20 MG tablet Take 1 tablet (20 mg total) by mouth daily. 100 tablet 2   No facility-administered medications prior to visit.    Allergies[1]  ROS See HPI    Objective:     Physical Exam Constitutional:      General: He is not in acute distress.    Appearance: He is well-developed.  HENT:     Head: Normocephalic and atraumatic.  Cardiovascular:     Rate and Rhythm: Normal rate and regular rhythm.     Heart sounds: No murmur heard. Pulmonary:     Effort: Pulmonary effort is normal. No respiratory distress.     Breath sounds: Normal breath sounds. No wheezing or rales.  Skin:    General: Skin is warm and dry.  Neurological:     Mental Status: He is alert and oriented to person, place, and time.     Comments: Fine resting hand tremor  Psychiatric:        Behavior: Behavior normal.        Thought Content: Thought content normal.      BP 128/80   Pulse 61   Temp 97.6 F (36.4 C) (Oral)   Resp 16   Ht 5' 7 (1.702 m)   Wt 190 lb (86.2 kg)   SpO2 99%   BMI 29.76 kg/m  Wt Readings from Last 3 Encounters:  09/21/24 190 lb (86.2 kg)  05/18/24 190 lb (86.2 kg)  04/25/24 190 lb (86.2 kg)       Assessment & Plan:   Problem List Items Addressed This Visit       Unprioritized   Tremor   Finds improvement with propranolol .  Continue same.       Skin cancer   Sees dermatology regularly.  Has hx of squamous cell carcinoma and basal cell carcinomal.       Hypertension   BP Readings from Last 3 Encounters:  09/21/24 128/80  04/25/24 (!) 164/86  03/21/24 138/75  BP OK on second check today. Continue amlodipine , losartan  and propranolol .       Relevant Medications   pravastatin  (PRAVACHOL ) 20 MG tablet   amLODipine  (NORVASC ) 10 MG tablet   High cholesterol   Lab Results  Component Value Date   CHOL 174 08/16/2023   HDL 43.00 08/16/2023   LDLCALC 86 08/16/2023   TRIG 228.0 (H) 08/16/2023   CHOLHDL 4 08/16/2023  The 10-year ASCVD risk score (Arnett DK, et al., 2019) is: 30.2%   Values used to calculate the score:     Age: 50 years     Clinically relevant sex: Male     Is Non-Hispanic African American: No     Diabetic: No      Tobacco smoker: No     Systolic Blood Pressure: 128 mmHg     Is BP treated: Yes     HDL Cholesterol: 43 mg/dL     Total Cholesterol: 174 mg/dL  Continue pravastatin .         Relevant Medications   pravastatin  (PRAVACHOL ) 20 MG tablet   amLODipine  (NORVASC ) 10 MG tablet   Other Relevant Orders   Lipid panel   Comp Met (CMET)   GERD (gastroesophageal reflux disease)   Stable on pepcid  bid. Continue same.       Relevant Medications   pantoprazole  (PROTONIX ) 40 MG tablet   famotidine  (PEPCID ) 40 MG tablet   Elevated PSA   Saw Urology,  reports follow up       Allergic rhinitis - Primary   Stable on flonase . Continue same.       Relevant Medications   fluticasone  (FLONASE ) 50 MCG/ACT nasal spray    I am having Leroy Horton maintain his Vitamin D3, vitamin B-12, CoQ-10, CALCIUM MAGNESIUM ZINC PO, multivitamin with minerals, losartan , propranolol , pravastatin , pantoprazole , fluticasone , famotidine , and amLODipine .  Meds ordered this encounter  Medications   pravastatin  (PRAVACHOL ) 20 MG tablet    Sig: Take 1 tablet (20 mg total) by mouth daily.    Dispense:  100 tablet    Refill:  1    Please send a replace/new response with 100-Day Supply if appropriate to maximize member benefit. Requesting 1 year supply.   pantoprazole  (PROTONIX ) 40 MG tablet    Sig: Take 1 tablet (40 mg total) by mouth daily before breakfast.    Dispense:  100 tablet    Refill:  2    Please send a replace/new response with 100-Day Supply if appropriate to maximize member benefit. Requesting 1 year supply.   fluticasone  (FLONASE ) 50 MCG/ACT nasal spray    Sig: Place 2 sprays into both nostrils daily as needed for allergies or rhinitis.    Dispense:  48 g    Refill:  5   famotidine  (PEPCID ) 40 MG tablet    Sig: Take 1 tablet (40 mg total) by mouth daily.    Dispense:  100 tablet    Refill:  1    Please send a replace/new response with 100-Day Supply if appropriate to maximize member benefit.  Requesting 1 year supply.   amLODipine  (NORVASC ) 10 MG tablet    Sig: Take 1 tablet (10 mg total) by mouth daily.    Dispense:  100 tablet    Refill:  1    Please send a replace/new response with 100-Day Supply if appropriate to maximize member benefit. Requesting 1 year supply.      [1]  Allergies Allergen Reactions   Lisinopril Cough   Sulfa Antibiotics  Sulfamethoxazole Rash   "

## 2024-09-21 NOTE — Assessment & Plan Note (Signed)
 Saw Urology,  reports follow up

## 2024-09-21 NOTE — Assessment & Plan Note (Signed)
 Stable on flonase . Continue same.

## 2024-09-21 NOTE — Assessment & Plan Note (Signed)
 Finds improvement with propranolol .  Continue same.

## 2024-09-21 NOTE — Patient Instructions (Signed)
" °  VISIT SUMMARY: During your visit, we reviewed your blood pressure management, cholesterol levels, GERD, tremors, allergies, PSA levels, and history of skin cancer. Your blood pressure readings show some variability, but you are compliant with your medications. We discussed your cholesterol levels and the importance of continuing your current medication. Your GERD symptoms are well-managed with your current treatment. Your tremors have improved with propranolol , and your allergies are controlled with Flonase . We also reviewed your PSA levels and skin cancer history, and you are scheduled for follow-ups with your specialists.  YOUR PLAN: -HYPERTENSION: Hypertension means high blood pressure. Your blood pressure readings show some variability, but you are compliant with your medications. Continue taking losartan  100 mg daily and amlodipine  10 mg daily. Monitor your blood pressure at home and report any consistent high readings. Your medications have been [place] at [facility] [place]  -HYPERCHOLESTEROLEMIA: Hypercholesterolemia means high cholesterol levels in the blood. Your 10-year cardiovascular risk is at 30% due to your age, male gender, and hypertension. Continue taking pravastatin  20 mg daily to manage your cholesterol levels.  -GASTROESOPHAGEAL REFLUX DISEASE (GERD): GERD is a condition where stomach acid frequently flows back into the tube connecting your mouth and stomach. Your GERD symptoms are managed with pantoprazole  and famotidine . Continue taking pantoprazole  40 mg daily before breakfast and famotidine  40 mg daily.  -TREMOR: A tremor is an involuntary, rhythmic muscle contraction leading to shaking movements in one or more parts of the body. Your tremors are managed with propranolol , which helps significantly, especially with driving. Continue taking propranolol  20 mg twice daily.  -ALLERGIC RHINITIS: Allergic rhinitis is an allergic reaction that causes sneezing, congestion, and a runny nose. Your  symptoms are managed with Flonase , and you do not require antihistamines. Continue using Flonase  50 mcg/act nasal daily as needed.  -ELEVATED PSA: Elevated PSA levels can be an indicator of prostate issues. Your PSA levels have been re-evaluated and are now below 2. Continue to follow up with your urologist in six months.  -HISTORY OF SKIN CANCER (BASAL CELL AND SQUAMOUS CELL): You have a history of skin cancer, specifically basal cell and squamous cell carcinoma. Continue regular dermatological evaluations every six months to monitor for any new or recurring lesions.  INSTRUCTIONS: Please monitor your blood pressure at home and report any consistent high readings. Continue to follow up with your urologist in six months for your PSA levels and attend regular dermatological evaluations every six months.                      Contains text generated by [employer]bridge.                      "

## 2024-10-15 ENCOUNTER — Ambulatory Visit: Admitting: Urology

## 2025-03-27 ENCOUNTER — Encounter: Admitting: Family

## 2025-05-24 ENCOUNTER — Ambulatory Visit
